# Patient Record
Sex: Female | Born: 1980 | Race: White | Hispanic: No | Marital: Married | Smoking: Never smoker
Health system: Southern US, Community
[De-identification: ages and names within clinical notes are randomized; demographics above are authoritative.]

## PROBLEM LIST (undated history)

## (undated) DIAGNOSIS — T7840XA Allergy, unspecified, initial encounter: Secondary | ICD-10-CM

## (undated) DIAGNOSIS — Z789 Other specified health status: Secondary | ICD-10-CM

## (undated) DIAGNOSIS — I10 Essential (primary) hypertension: Secondary | ICD-10-CM

## (undated) HISTORY — DX: Essential (primary) hypertension: I10

## (undated) HISTORY — DX: Allergy, unspecified, initial encounter: T78.40XA

## (undated) HISTORY — PX: NO PAST SURGERIES: SHX2092

---

## 2007-01-19 ENCOUNTER — Ambulatory Visit: Payer: Self-pay | Admitting: Family Medicine

## 2007-09-08 ENCOUNTER — Ambulatory Visit: Payer: Self-pay | Admitting: Family Medicine

## 2007-09-08 DIAGNOSIS — L03019 Cellulitis of unspecified finger: Secondary | ICD-10-CM | POA: Insufficient documentation

## 2007-11-06 ENCOUNTER — Encounter: Payer: Self-pay | Admitting: Family Medicine

## 2009-11-08 ENCOUNTER — Inpatient Hospital Stay (HOSPITAL_COMMUNITY): Admission: AD | Admit: 2009-11-08 | Discharge: 2009-11-08 | Payer: Self-pay | Admitting: Obstetrics and Gynecology

## 2009-11-08 ENCOUNTER — Inpatient Hospital Stay (HOSPITAL_COMMUNITY): Admission: AD | Admit: 2009-11-08 | Discharge: 2009-11-10 | Payer: Self-pay | Admitting: Obstetrics and Gynecology

## 2009-11-16 ENCOUNTER — Ambulatory Visit: Admission: RE | Admit: 2009-11-16 | Discharge: 2009-11-16 | Payer: Self-pay | Admitting: Obstetrics and Gynecology

## 2009-11-23 ENCOUNTER — Ambulatory Visit: Admission: RE | Admit: 2009-11-23 | Discharge: 2009-11-23 | Payer: Self-pay | Admitting: Obstetrics and Gynecology

## 2010-12-16 NOTE — L&D Delivery Note (Signed)
Delivery Note At 1:58 AM a viable female was delivered via  (Presentation: ;  ).  APGAR: , ; weight .   Placenta status: , .  Cord:  with the following complications: .  Cord pH: 7.10  Anesthesia:   Episiotomy:  Lacerations:  Suture Repair: vicryl rapide Est. Blood Loss (mL):   Mom to postpartum.  Baby to nursery-stable.  Rapid progress to SVD, straight OP w/ tight nuchal cord X 1, clamped + cut.  Infant required O2 with Ambu>>>NICU called, rapid recovery with APGAR 5/8, placenta spot intact, 2 deg perineal lac repaired.  Mother + baby doing well  Meriel Pica 09/06/2011, 2:19 AM

## 2011-03-20 LAB — CBC
HCT: 40.2 % (ref 36.0–46.0)
Hemoglobin: 12.3 g/dL (ref 12.0–15.0)
MCHC: 34.2 g/dL (ref 30.0–36.0)
Platelets: 182 10*3/uL (ref 150–400)
RBC: 3.74 MIL/uL — ABNORMAL LOW (ref 3.87–5.11)
RDW: 14.2 % (ref 11.5–15.5)
WBC: 16.4 10*3/uL — ABNORMAL HIGH (ref 4.0–10.5)

## 2011-03-20 LAB — RPR: RPR Ser Ql: NONREACTIVE

## 2011-05-03 NOTE — Assessment & Plan Note (Signed)
[facility]                           [facility] OFFICE NOTE   NAME:SLOANETashianna, Broome                         MRN:          [id number]  DATE:01/19/2007                            DOB:          1981/07/11    CHIEF COMPLAINT:  A 30 year old white female here to establish new  doctor.   HISTORY OF PRESENT ILLNESS:  Ms. Nienhuis moved from [place] in April  2007.  She has not seen a primary care doctor in many years.  She comes  to clinic today with the following concern:  Infected finger, chronic:  She states that she has been having problems with erythema, raised nail  bed, and intermittent drainage off and on since August of 2007 in her  right third digit at the nailbed.  She states that it drains thick white-  yellow fluid occasionally.  It is not usually very tender.  She has had  some effect on her nail where it is now flaking off and is rippled on  the lateral edge.  She works at [employer] and so her hands are wet very  frequently.  She does not bite her nails or suck on her fingers.   REVIEW OF SYSTEMS:  No headache, no dyspnea, wears contacts, no hearing  problems, no chest pain, palpitations, nausea, vomiting, diarrhea,  constipation, myalgias, arthralgias.   PAST MEDICAL HISTORY:  None.   PAST SURGERY OR PROCEDURES:  Pap smear January 2008 negative.   ALLERGIES:  NONE.   MEDICATIONS:  1. Aviane birth control pills.  2. Calcium 1 tablet daily.   SOCIAL HISTORY:  She is a former smoker with 1 pack/year history  approximately 10 years ago.  She rarely drinks alcohol, about 1-2  glasses of wine per month.  She has no history of drug use.  She works  at [employer] as well as [employer] part time.  She has been married  for 3 years, she has no children.  She exercises 3 times per week, 30  minutes at a time cardiovascular.  She eats 3 meals a day with  vegetables, occasional fruit, and avoid fast food.   FAMILY HISTORY:  Father alive at age  41 with testicular cancer.  Mother  alive at age 68 healthy.  No heart history before age 59.  One brother  healthy.  No diabetes.  Paternal grandmother with breast cancer  developed at age 106.  Paternal grandfather with renal cell cancer.  Maternal grandfather with lung cancer.  Maternal grandmother with  leukemia.   PHYSICAL EXAMINATION:  VITAL SIGNS:  Height 68 inches, weight 142, blood  pressure 96/76, pulse 68, temperature 98.1.  GENERAL  Healthy appearing female in no apparent distress.  HEENT:  PERRLA, extraocular muscles intact, oropharynx clear, tympanic  membranes clear, nares clear, no thyromegaly, no lymphadenopathy  supraclavicular or cervical.  EXTREMITIES:  Third digit medial nail bed slightly erythematous, raised,  slightly swollen, nail irregular and flaking along medial third.  No  fluctuance, nontender.  CARDIOVASCULAR:  Regular rate and rhythm, no murmurs, rubs, or gallops.  Normal PMI, 2+ peripheral pulses,  no peripheral edema.  LUNGS:  Clear to auscultation bilaterally.  No wheezes, rales, or  rhonchi.  ABDOMEN:  Soft, nontender, normoactive bowel sounds, no  hepatosplenomegaly.  MUSCULOSKELETAL:  Strength 5/5 in upper and lower extremities.  NEURO:  Cranial nerves II-XII grossly intact, alert and oriented x3.  REFLEXES:  Patellar 2+.   ASSESSMENT/PLAN:  1. Chronic paronychia:  The patient was given information on how to      prevent this happening in the future such as not sucking on      fingers, avoiding frequent water immersion by wearing gloves if      possible.  Given the recurrent low grade infection that has not      resolved, I do think there is likely a bacterial component at this      point and will treat her with Bactrim 2 tablets p.o. b.i.d. to      cover for broad skin bacteria as well methicillin-resistant      Staphylococcus aureus .  If her symptoms do not improve we can      always consider clipping the nail to consider fungal infection,       candidal infection, or can consider steroid cream at nail base for      possible dermatitis.   1. Prevention:  She was encouraged to continue with her healthy habits      of exercise and eating.  She was offered tetanus but refused.  She      was offered cholesterol screen and she will consider this.  She      will continue calcium daily.     Kerby Nora, MD  Electronically Signed    AB/MedQ  DD: 01/19/2007  DT: 01/19/2007  Job #: [id number]

## 2011-09-06 ENCOUNTER — Encounter (HOSPITAL_COMMUNITY): Payer: Self-pay | Admitting: *Deleted

## 2011-09-06 ENCOUNTER — Inpatient Hospital Stay (HOSPITAL_COMMUNITY)
Admission: AD | Admit: 2011-09-06 | Discharge: 2011-09-08 | DRG: 373 | Disposition: A | Discharge status: Home / Self Care | Payer: BC Managed Care – PPO | Source: Ambulatory Visit | Attending: Obstetrics and Gynecology | Admitting: Obstetrics and Gynecology

## 2011-09-06 LAB — COMPREHENSIVE METABOLIC PANEL
ALT: 12 U/L (ref 0–35)
AST: 21 U/L (ref 0–37)
Albumin: 2.7 g/dL — ABNORMAL LOW (ref 3.5–5.2)
Alkaline Phosphatase: 173 U/L — ABNORMAL HIGH (ref 39–117)
CO2: 20 mEq/L (ref 19–32)
Chloride: 105 mEq/L (ref 96–112)
GFR calc non Af Amer: >60 mL/min (ref >60)
Potassium: 3.9 mEq/L (ref 3.5–5.1)
Total Bilirubin: 0.3 mg/dL (ref 0.3–1.2)

## 2011-09-06 LAB — CBC
HCT: 36.5 % (ref 36.0–46.0)
Hemoglobin: 13.2 g/dL (ref 12.0–15.0)
MCH: 31.4 pg (ref 26.0–34.0)
MCHC: 34 g/dL (ref 30.0–36.0)
MCV: 92.4 fL (ref 78.0–100.0)
MCV: 92.4 fL (ref 78.0–100.0)
Platelets: 186 10*3/uL (ref 150–400)
RBC: 4.23 MIL/uL (ref 3.87–5.11)
RDW: 14.1 % (ref 11.5–15.5)
WBC: 17.4 10*3/uL — ABNORMAL HIGH (ref 4.0–10.5)
WBC: 20.7 10*3/uL — ABNORMAL HIGH (ref 4.0–10.5)

## 2011-09-06 LAB — RPR: RPR: NONREACTIVE

## 2011-09-06 LAB — URIC ACID: Uric Acid, Serum: 3.5 mg/dL (ref 2.4–7.0)

## 2011-09-06 LAB — HIV ANTIBODY (ROUTINE TESTING W REFLEX): HIV: NONREACTIVE

## 2011-09-06 LAB — ANTIBODY SCREEN: Antibody Screen: NEGATIVE

## 2011-09-06 MED ORDER — OXYTOCIN BOLUS FROM INFUSION
500.0000 mL | Freq: Once | INTRAVENOUS | Status: AC
  Administered 2011-09-06: 500 mL via INTRAVENOUS
  Filled 2011-09-06: qty 500

## 2011-09-06 MED ORDER — BISACODYL 10 MG RE SUPP
10.0000 mg | Freq: Every day | RECTAL | Status: DC | PRN
  Filled 2011-09-06: qty 1

## 2011-09-06 MED ORDER — PRENATAL PLUS 27-1 MG PO TABS
1.0000 | ORAL_TABLET | Freq: Every day | ORAL | Status: DC
  Administered 2011-09-07 – 2011-09-08 (×2): 1 via ORAL
  Filled 2011-09-06 (×2): qty 1

## 2011-09-06 MED ORDER — BENZOCAINE-MENTHOL 20-0.5 % EX AERO
1.0000 "application " | INHALATION_SPRAY | CUTANEOUS | Status: DC | PRN
  Administered 2011-09-06: 1 via TOPICAL
  Filled 2011-09-06: qty 56

## 2011-09-06 MED ORDER — ONDANSETRON HCL 4 MG/2ML IJ SOLN: 4.0000 mg | Freq: Four times a day (QID) | INTRAMUSCULAR | Status: DC | PRN

## 2011-09-06 MED ORDER — OXYTOCIN 20 UNITS IN LACTATED RINGERS INFUSION - SIMPLE
INTRAVENOUS | Status: AC
  Filled 2011-09-06: qty 1000

## 2011-09-06 MED ORDER — MEASLES, MUMPS & RUBELLA VAC ~~LOC~~ INJ
0.5000 mL | INJECTION | Freq: Once | SUBCUTANEOUS | Status: DC
  Filled 2011-09-06: qty 0.5

## 2011-09-06 MED ORDER — LIDOCAINE HCL (PF) 1 % IJ SOLN
30.0000 mL | INTRAMUSCULAR | Status: DC | PRN
  Administered 2011-09-06: 30 mL via SUBCUTANEOUS

## 2011-09-06 MED ORDER — ONDANSETRON HCL 4 MG PO TABS: 4.0000 mg | ORAL_TABLET | ORAL | Status: DC | PRN

## 2011-09-06 MED ORDER — CITRIC ACID-SODIUM CITRATE 334-500 MG/5ML PO SOLN: 30.0000 mL | ORAL | Status: DC | PRN

## 2011-09-06 MED ORDER — LACTATED RINGERS IV SOLN
INTRAVENOUS | Status: DC
  Administered 2011-09-06: 02:00:00 via INTRAVENOUS

## 2011-09-06 MED ORDER — IBUPROFEN 800 MG PO TABS
800.0000 mg | ORAL_TABLET | Freq: Three times a day (TID) | ORAL | Status: DC | PRN
  Administered 2011-09-07: 600 mg via ORAL
  Administered 2011-09-07 – 2011-09-08 (×2): 800 mg via ORAL
  Filled 2011-09-06 (×4): qty 1

## 2011-09-06 MED ORDER — SIMETHICONE 80 MG PO CHEW: 80.0000 mg | CHEWABLE_TABLET | ORAL | Status: DC | PRN

## 2011-09-06 MED ORDER — OXYTOCIN 20 UNITS IN LACTATED RINGERS INFUSION - SIMPLE: 125.0000 mL/h | Freq: Once | INTRAVENOUS | Status: DC

## 2011-09-06 MED ORDER — SENNOSIDES-DOCUSATE SODIUM 8.6-50 MG PO TABS
2.0000 | ORAL_TABLET | Freq: Every day | ORAL | Status: DC
  Administered 2011-09-07: 2 via ORAL

## 2011-09-06 MED ORDER — BENZOCAINE-MENTHOL 20-0.5 % EX AERO
INHALATION_SPRAY | CUTANEOUS | Status: AC
  Filled 2011-09-06: qty 56

## 2011-09-06 MED ORDER — LIDOCAINE HCL (PF) 1 % IJ SOLN
INTRAMUSCULAR | Status: AC
  Filled 2011-09-06: qty 30

## 2011-09-06 MED ORDER — FLEET ENEMA 7-19 GM/118ML RE ENEM: 1.0000 | ENEMA | RECTAL | Status: DC | PRN

## 2011-09-06 MED ORDER — DIBUCAINE 1 % RE OINT
1.0000 "application " | TOPICAL_OINTMENT | RECTAL | Status: DC | PRN
  Filled 2011-09-06 (×2): qty 28

## 2011-09-06 MED ORDER — ZOLPIDEM TARTRATE 5 MG PO TABS: 5.0000 mg | ORAL_TABLET | Freq: Every evening | ORAL | Status: DC | PRN

## 2011-09-06 MED ORDER — WITCH HAZEL-GLYCERIN EX PADS: 1.0000 "application " | MEDICATED_PAD | CUTANEOUS | Status: DC | PRN

## 2011-09-06 MED ORDER — DIPHENHYDRAMINE HCL 25 MG PO CAPS: 25.0000 mg | ORAL_CAPSULE | Freq: Four times a day (QID) | ORAL | Status: DC | PRN

## 2011-09-06 MED ORDER — OXYCODONE-ACETAMINOPHEN 5-325 MG PO TABS: 2.0000 | ORAL_TABLET | ORAL | Status: DC | PRN

## 2011-09-06 MED ORDER — TETANUS-DIPHTH-ACELL PERTUSSIS 5-2.5-18.5 LF-MCG/0.5 IM SUSP
0.5000 mL | Freq: Once | INTRAMUSCULAR | Status: DC
  Filled 2011-09-06: qty 0.5

## 2011-09-06 MED ORDER — LANOLIN HYDROUS EX OINT: TOPICAL_OINTMENT | CUTANEOUS | Status: DC | PRN

## 2011-09-06 MED ORDER — DEXTROSE IN LACTATED RINGERS 5 % IV SOLN: INTRAVENOUS | Status: DC

## 2011-09-06 MED ORDER — ONDANSETRON HCL 4 MG/2ML IJ SOLN: 4.0000 mg | INTRAMUSCULAR | Status: DC | PRN

## 2011-09-06 MED ORDER — OXYCODONE-ACETAMINOPHEN 5-325 MG PO TABS: 1.0000 | ORAL_TABLET | Freq: Four times a day (QID) | ORAL | Status: DC | PRN

## 2011-09-06 MED ORDER — ACETAMINOPHEN 325 MG PO TABS: 650.0000 mg | ORAL_TABLET | ORAL | Status: DC | PRN

## 2011-09-06 MED ORDER — LACTATED RINGERS IV SOLN
500.0000 mL | INTRAVENOUS | Status: DC | PRN
  Administered 2011-09-06: 300 mL via INTRAVENOUS

## 2011-09-06 MED ORDER — IBUPROFEN 600 MG PO TABS
600.0000 mg | ORAL_TABLET | Freq: Four times a day (QID) | ORAL | Status: DC | PRN
  Administered 2011-09-06: 600 mg via ORAL
  Filled 2011-09-06: qty 1

## 2011-09-06 NOTE — Progress Notes (Signed)
Admission screening reveals patient had Tdap in 01/2009 and is rubella immune. Orders removed from standing PP orders.

## 2011-09-06 NOTE — Progress Notes (Signed)
Pt taken to room 165 at this time.

## 2011-09-06 NOTE — Progress Notes (Signed)
Pt G2 P1 at 40wks, leaking clear fluid since 0015.  Denies any problems with pregnancy.

## 2011-09-06 NOTE — Progress Notes (Addendum)
Dr. Marcelle Overlie notified of increased blood pressures. MD states that he does not treat unless diastolic BP is 105. Orders received to draw PIH labs.

## 2011-09-06 NOTE — Progress Notes (Signed)
Notified of VE.  Pt may go to room 165. 

## 2011-09-06 NOTE — Progress Notes (Signed)
Dr Marcelle Overlie notified of pt presenting for labor check. Notified of ctx pattern and fhr pattern.  Notified of VE.

## 2011-09-06 NOTE — H&P (Signed)
Haydn Cush is a 30 y.o. female presenting for advanced labor. Maternal Medical History:  Reason for admission: Reason for admission: rupture of membranes and contractions.  Contractions: Onset was 3-5 hours ago.   Frequency: regular.   Perceived severity is moderate.    Fetal activity: Perceived fetal activity is normal.   Last perceived fetal movement was within the past hour.      OB History    Grav Para Term Preterm Abortions TAB SAB Ect Mult Living   2 1 1  0 0 0 0 0 0 1     No past medical history on file. No past surgical history on file. Family History: family history is not on file. Social History:  does not have a smoking history on file. She does not have any smokeless tobacco history on file. Her alcohol and drug histories not on file.  ROS  Dilation: 10 Effacement (%): 100 Station: -1 Exam by:: Daiva Nakayama, RN Blood pressure 135/80, pulse 103, temperature 98.3 F (36.8 C), temperature source Oral, resp. rate 20, height 5\' 7"  (1.702 m), weight 90.719 kg (200 lb), SpO2 100.00%. Exam Physical Exam  Prenatal labs: ABO, Rh: O/Positive/-- (09/21 0142) Antibody: Negative (09/21 0142) Rubella: Immune (09/21 0143) RPR: Nonreactive (09/21 0142)  HBsAg: Negative (09/21 0143)  HIV: Non-reactive (09/21 0143)  GBS: Negative (09/21 0143)   Assessment/Plan: Term preg., advanced labor>>>to L and D   Deysy Schabel M 09/06/2011, 2:18 AM

## 2011-09-07 NOTE — Progress Notes (Signed)
No C/O Lochia decreasing  VSS Afeb FFNT  Satisfactory

## 2011-09-08 MED ORDER — OXYCODONE-ACETAMINOPHEN 5-325 MG PO TABS: 1.0000 | ORAL_TABLET | Freq: Four times a day (QID) | ORAL | Status: AC | PRN

## 2011-09-08 NOTE — Discharge Summary (Signed)
Obstetric Discharge Summary Reason for Admission: onset of labor Prenatal Procedures: ultrasound Intrapartum Procedures: spontaneous vaginal delivery Postpartum Procedures: none Complications-Operative and Postpartum: none Hemoglobin  Date Value Range Status  09/06/2011 12.4  12.0-15.0 (g/dL) Final     HCT  Date Value Range Status  09/06/2011 36.5  36.0-46.0 (%) Final    Discharge Diagnoses: Term Pregnancy-delivered  Discharge Information: Date: 09/08/2011 Activity: pelvic rest Diet: routine Medications: PNV and Percocet Condition: stable Instructions: refer to practice specific booklet Discharge to: home Follow-up Information    Follow up with ADKINS,GRETCHEN. Call in 6 weeks.   Contact information:   [address], [address] [place] [address] [phone]          Newborn Data: Live born female  Birth Weight: 8 lb 7.1 oz (3830 g) APGAR: 5, 8  Home with mother.  Roxie Gueye II,Sherlyne Crownover E 09/08/2011, 6:28 AM

## 2011-09-08 NOTE — Progress Notes (Signed)
No C/O Decreasing lochia  VSS Afeb FFNT  D/C home Instructions given Percocet 5/325 #30 PNV 

## 2014-03-30 ENCOUNTER — Encounter: Payer: Self-pay | Admitting: Family Medicine

## 2014-03-30 ENCOUNTER — Ambulatory Visit (INDEPENDENT_AMBULATORY_CARE_PROVIDER_SITE_OTHER): Payer: 59 | Admitting: Family Medicine

## 2014-03-30 VITALS — BP 116/78 | HR 80 | Temp 98.2°F | Ht 66.75 in | Wt 156.5 lb

## 2014-03-30 DIAGNOSIS — J029 Acute pharyngitis, unspecified: Secondary | ICD-10-CM

## 2014-03-30 DIAGNOSIS — J069 Acute upper respiratory infection, unspecified: Secondary | ICD-10-CM

## 2014-03-30 LAB — POCT RAPID STREP A (OFFICE): RAPID STREP A SCREEN: NEGATIVE

## 2014-03-30 NOTE — Progress Notes (Signed)
Pre visit review using our clinic review tool, if applicable. No additional management support is needed unless otherwise documented below in the visit note. 

## 2014-03-30 NOTE — Progress Notes (Signed)
Patient Name: Christine Rivera Date of Birth: 1981/02/11 Medical Record Number: [id number]  History of Present Illness:  This 33 y.o. female patient presents with runny nose, sneezing, cough, sore throat, malaise and minimal / low-grade fever .  New OV, no enc since 2008.  Has been sick since last wed.  Started out with a sore throat and had some allergies.  Thursday and Friday and had some fever and felt tired with some faigue.   Only a little bit of a cough. Cough mostly gone.  Nausea yesterday.   Sick.  ? recent exposure to others with similar symptoms.   Denies sthortness of breath/wheezing, high fever, chest pain, rhinits for more than 14 days, significant myalgia, otalgia, facial pain, abdominal pain, changes in bowel or bladder.  PMH, PHS, Allergies, Problem List, Medications, Family History, and Social History have all been reviewed.  Patient Active Problem List   Diagnosis Date Noted  . PARONYCHIA, FINGER 09/08/2007    No past medical history on file.  No past surgical history on file.  History   Social History  . Marital Status: Married    Spouse Name: N/A    Number of Children: N/A  . Years of Education: N/A   Occupational History  . Not on file.   Social History Main Topics  . Smoking status: Never Smoker   . Smokeless tobacco: Not on file  . Alcohol Use: Not on file  . Drug Use: Not on file  . Sexual Activity: Not on file   Other Topics Concern  . Not on file   Social History Narrative  . No narrative on file    No family history on file.  No Known Allergies  Medication list reviewed and updated in full in [facility].  Review of Systems: as above, eating and drinking - tolerating PO. Urinating normally. No excessive vomitting or diarrhea. O/w as above.  Physical Exam:  Filed Vitals:   03/30/14 1516  BP: 116/78  Pulse: 80  Temp: 98.2 F (36.8 C)  TempSrc: Oral  Height: 5' 6.75" (1.695 m)  Weight: 156 lb 8 oz (70.988 kg)    SpO2: 97%    GEN: WDWN, Non-toxic, Atraumatic, normocephalic. A and O x 3. HEENT: Oropharynx clear without exudate, MMM, no significant LAD, mild rhinnorhea Ears: TM clear, COL visualized with good landmarks CV: RRR, no m/g/r. Pulm: CTA B, no wheezes, rhonchi, or crackles, normal respiratory effort. EXT: no c/c/e Psych: well oriented, neither depressed nor anxious in appearance  Objective Data: Results for orders placed in visit on 03/30/14  POCT RAPID STREP A (OFFICE)      Result Value Ref Range   Rapid Strep A Screen Negative  Negative    A/P: 1. URI. Supportive care reviewed with patient. See patient instruction section.  Patient Instructions: Upper Respiratory Infection and Viral Syndromes -Viral Infections  TREATMENT THAT HELPS WITH SYMPTOMS: 1. Drink plenty of fluids, but limit caffeine  2. Decongestant: for congested noses, sinuses, and ear tubes. Pressure release and help drainage: Sudafed or D component of combination medication. (pseudephedrine or Phenylephrine) (NOT IF YOU HAVE HIGH BLOOD PRESSURE) Avoid at night: sudafed makes it harder to sleep  3. Nasal Sprays: Relieve pressure, promote drainage, open nasal and ear passages. Afrin can be used for only 3-4 days in row.  4. Cough Suppressants: Delsym is an example of a cough suppressant. It lasts for 12 hours. Strongest over the counter.   6. Expectorants: Liquify secretions and improve drainage Mucinex  is a 12 hour expectorant. (Extended release guaifenesin)  For cheaper expectorant or Mucinex alternative, get generic guaifenesin (Not extended release) from any drug store. Take 1 1/2 tablets in the morning and 1 1/2 tablets at noon.   THESE WILL MAKE YOU FEEL BETTER FOR A WHILE, SO YOU CAN DEAL WITH YOUR SYMPTOMS. YOUR BODY HAS TO HEAL ITSELF.  ANTIBIOTICS DO NOT HELP IF YOU HAVE A VIRUS OR BAD COLD.  Antibiotics kill bacteria not viruses.  Bad viruses can make you feel just as bad or worse than bacterial  infections (Like the flu - it is a virus)  Ask a pharmacist for help if you cannot find these medications, and they will help you.   Signed,  Maud Deed. Emilyanne Mcgough, MD, [place] at [facility] [place] [address] Phone: [phone] Fax: [phone]   Patient's Medications   No medications on file

## 2014-04-08 ENCOUNTER — Ambulatory Visit (INDEPENDENT_AMBULATORY_CARE_PROVIDER_SITE_OTHER): Payer: 59 | Admitting: Family Medicine

## 2014-04-08 ENCOUNTER — Encounter: Payer: Self-pay | Admitting: Family Medicine

## 2014-04-08 VITALS — BP 110/80 | HR 86 | Temp 98.2°F | Ht 66.75 in | Wt 160.8 lb

## 2014-04-08 DIAGNOSIS — Z0000 Encounter for general adult medical examination without abnormal findings: Secondary | ICD-10-CM

## 2014-04-08 DIAGNOSIS — Z13220 Encounter for screening for lipoid disorders: Secondary | ICD-10-CM

## 2014-04-08 NOTE — Progress Notes (Signed)
   Subjective:    Patient ID: Christine Rivera, female    DOB: 1981-07-19, 33 y.o.   MRN: [id number]  HPI   33 year old female presents to re-establish. She has been followed by Dr.Adkins. GYN does yearly physical.  Viral URI symptoms are improved since last week.   No concerns.  Doing well overall.    Review of Systems  Constitutional: Negative for fever and fatigue.  HENT: Negative for ear pain.   Eyes: Negative for pain.  Respiratory: Negative for cough and wheezing.   Cardiovascular: Negative for chest pain, palpitations and leg swelling.  Gastrointestinal: Negative for abdominal pain.       Objective:   Physical Exam  Constitutional: Vital signs are normal. She appears well-developed and well-nourished. She is cooperative.  Non-toxic appearance. She does not appear ill. No distress.  HENT:  Head: Normocephalic.  Right Ear: Hearing, tympanic membrane, external ear and ear canal normal.  Left Ear: Hearing, tympanic membrane, external ear and ear canal normal.  Nose: Nose normal.  Eyes: Conjunctivae, EOM and lids are normal. Pupils are equal, round, and reactive to light. Lids are everted and swept, no foreign bodies found.  Neck: Trachea normal and normal range of motion. Neck supple. Carotid bruit is not present. No mass and no thyromegaly present.  Cardiovascular: Normal rate, regular rhythm, S1 normal, S2 normal, normal heart sounds and intact distal pulses.  Exam reveals no gallop.   No murmur heard. Pulmonary/Chest: Effort normal and breath sounds normal. No respiratory distress. She has no wheezes. She has no rhonchi. She has no rales.  Abdominal: Soft. Normal appearance and bowel sounds are normal. She exhibits no distension, no fluid wave, no abdominal bruit and no mass. There is no hepatosplenomegaly. There is no tenderness. There is no rebound, no guarding and no CVA tenderness. No hernia.  Lymphadenopathy:    She has no cervical adenopathy.    She has no axillary  adenopathy.  Neurological: She is alert. She has normal strength. No cranial nerve deficit or sensory deficit.  Skin: Skin is warm, dry and intact. No rash noted.  Psychiatric: Her speech is normal and behavior is normal. Judgment normal. Her mood appears not anxious. Cognition and memory are normal. She does not exhibit a depressed mood.          Assessment & Plan:  The patient's preventative maintenance and recommended screening tests for an annual wellness exam were reviewed in full today. Brought up to date unless services declined.  Counselled on the importance of diet, exercise, and its role in overall health and mortality. The patient's FH and SH was reviewed, including their home life, tobacco status, and drug and alcohol status.   Gyn does breast and pelvic exam  Up to date with TDap.

## 2014-04-08 NOTE — Patient Instructions (Addendum)
Schedule fasting lab  in next few months. Work on getting back to regular exercise 3-5 times a week, continue healthy eating.   Exercise to Stay Healthy Exercise helps you become and stay healthy. EXERCISE IDEAS AND TIPS Choose exercises that:  You enjoy.  Fit into your day. You do not need to exercise really hard to be healthy. You can do exercises at a slow or medium level and stay healthy. You can:  Stretch before and after working out.  Try yoga, Pilates, or tai chi.  Lift weights.  Walk fast, swim, jog, run, climb stairs, bicycle, dance, or rollerskate.  Take aerobic classes. Exercises that burn about 150 calories:  Running 1  miles in 15 minutes.  Playing volleyball for 45 to 60 minutes.  Washing and waxing a car for 45 to 60 minutes.  Playing touch football for 45 minutes.  Walking 1  miles in 35 minutes.  Pushing a stroller 1  miles in 30 minutes.  Playing basketball for 30 minutes.  Raking leaves for 30 minutes.  Bicycling 5 miles in 30 minutes.  Walking 2 miles in 30 minutes.  Dancing for 30 minutes.  Shoveling snow for 15 minutes.  Swimming laps for 20 minutes.  Walking up stairs for 15 minutes.  Bicycling 4 miles in 15 minutes.  Gardening for 30 to 45 minutes.  Jumping rope for 15 minutes.  Washing windows or floors for 45 to 60 minutes. Document Released: 01/04/2011 Document Revised: 02/24/2012 Document Reviewed: 01/04/2011 [facility] Patient Information 2014 [place], [place].

## 2014-04-08 NOTE — Progress Notes (Signed)
Pre visit review using our clinic review tool, if applicable. No additional management support is needed unless otherwise documented below in the visit note. 

## 2014-10-17 ENCOUNTER — Encounter: Payer: Self-pay | Admitting: Family Medicine

## 2015-03-03 LAB — OB RESULTS CONSOLE ABO/RH: RH TYPE: POSITIVE

## 2015-03-03 LAB — OB RESULTS CONSOLE HGB/HCT, BLOOD
HCT: 40 %
Hemoglobin: 13.3 g/dL

## 2015-03-03 LAB — OB RESULTS CONSOLE HIV ANTIBODY (ROUTINE TESTING): HIV: NONREACTIVE

## 2015-03-03 LAB — OB RESULTS CONSOLE RUBELLA ANTIBODY, IGM: Rubella: IMMUNE

## 2015-03-03 LAB — OB RESULTS CONSOLE ANTIBODY SCREEN: Antibody Screen: NEGATIVE

## 2015-03-03 LAB — OB RESULTS CONSOLE PLATELET COUNT: PLATELETS: 197 10*3/uL

## 2015-03-03 LAB — OB RESULTS CONSOLE RPR: RPR: NONREACTIVE

## 2015-03-03 LAB — OB RESULTS CONSOLE GC/CHLAMYDIA
Chlamydia: NEGATIVE
Gonorrhea: NEGATIVE

## 2015-03-03 LAB — OB RESULTS CONSOLE HEPATITIS B SURFACE ANTIGEN: HEP B S AG: NEGATIVE

## 2015-08-23 ENCOUNTER — Encounter (HOSPITAL_COMMUNITY): Payer: Self-pay | Admitting: *Deleted

## 2015-08-23 ENCOUNTER — Observation Stay (HOSPITAL_COMMUNITY): Payer: 59

## 2015-08-23 ENCOUNTER — Observation Stay (HOSPITAL_COMMUNITY)
Admission: AD | Admit: 2015-08-23 | Discharge: 2015-08-25 | Disposition: A | Discharge status: Home / Self Care | Payer: 59 | Source: Ambulatory Visit | Attending: Obstetrics & Gynecology | Admitting: Obstetrics & Gynecology

## 2015-08-23 DIAGNOSIS — O139 Gestational [pregnancy-induced] hypertension without significant proteinuria, unspecified trimester: Secondary | ICD-10-CM | POA: Diagnosis present

## 2015-08-23 DIAGNOSIS — Z3689 Encounter for other specified antenatal screening: Secondary | ICD-10-CM

## 2015-08-23 DIAGNOSIS — Z3A35 35 weeks gestation of pregnancy: Secondary | ICD-10-CM

## 2015-08-23 DIAGNOSIS — O133 Gestational [pregnancy-induced] hypertension without significant proteinuria, third trimester: Secondary | ICD-10-CM | POA: Diagnosis not present

## 2015-08-23 DIAGNOSIS — O09293 Supervision of pregnancy with other poor reproductive or obstetric history, third trimester: Secondary | ICD-10-CM

## 2015-08-23 DIAGNOSIS — O09523 Supervision of elderly multigravida, third trimester: Secondary | ICD-10-CM

## 2015-08-23 HISTORY — DX: Other specified health status: Z78.9

## 2015-08-23 LAB — CBC
HCT: 33.7 % — ABNORMAL LOW (ref 36.0–46.0)
Hemoglobin: 11 g/dL — ABNORMAL LOW (ref 12.0–15.0)
MCH: 28.6 pg (ref 26.0–34.0)
MCHC: 32.6 g/dL (ref 30.0–36.0)
MCV: 87.5 fL (ref 78.0–100.0)
Platelets: 165 10*3/uL (ref 150–400)
RBC: 3.85 MIL/uL — ABNORMAL LOW (ref 3.87–5.11)
RDW: 14.7 % (ref 11.5–15.5)
WBC: 7.8 10*3/uL (ref 4.0–10.5)

## 2015-08-23 LAB — PROTEIN / CREATININE RATIO, URINE
Creatinine, Urine: 134 mg/dL
PROTEIN CREATININE RATIO: 0.13 mg/mg{creat} (ref 0.00–0.15)
TOTAL PROTEIN, URINE: 18 mg/dL

## 2015-08-23 LAB — URIC ACID: Uric Acid, Serum: 4.3 mg/dL (ref 2.3–6.6)

## 2015-08-23 LAB — URINALYSIS, ROUTINE W REFLEX MICROSCOPIC
Bilirubin Urine: NEGATIVE
GLUCOSE, UA: NEGATIVE mg/dL
Hgb urine dipstick: NEGATIVE
Ketones, ur: NEGATIVE mg/dL
NITRITE: NEGATIVE
PH: 7 (ref 5.0–8.0)
Protein, ur: NEGATIVE mg/dL
SPECIFIC GRAVITY, URINE: 1.015 (ref 1.005–1.030)
Urobilinogen, UA: 0.2 mg/dL (ref 0.0–1.0)

## 2015-08-23 LAB — COMPREHENSIVE METABOLIC PANEL
ALT: 13 U/L — ABNORMAL LOW (ref 14–54)
AST: 20 U/L (ref 15–41)
Albumin: 2.9 g/dL — ABNORMAL LOW (ref 3.5–5.0)
Alkaline Phosphatase: 103 U/L (ref 38–126)
Anion gap: 8 (ref 5–15)
BUN: 9 mg/dL (ref 6–20)
CO2: 24 mmol/L (ref 22–32)
Calcium: 9.1 mg/dL (ref 8.9–10.3)
Chloride: 105 mmol/L (ref 101–111)
Creatinine, Ser: 0.64 mg/dL (ref 0.44–1.00)
GFR calc Af Amer: >60 mL/min (ref >60)
GFR calc non Af Amer: >60 mL/min (ref >60)
Glucose, Bld: 80 mg/dL (ref 65–99)
Potassium: 4.1 mmol/L (ref 3.5–5.1)
Sodium: 137 mmol/L (ref 135–145)
Total Bilirubin: 0.5 mg/dL (ref 0.3–1.2)
Total Protein: 6.3 g/dL — ABNORMAL LOW (ref 6.5–8.1)

## 2015-08-23 LAB — URINE MICROSCOPIC-ADD ON

## 2015-08-23 LAB — LACTATE DEHYDROGENASE: LDH: 138 U/L (ref 98–192)

## 2015-08-23 MED ORDER — PRENATAL MULTIVITAMIN CH
1.0000 | ORAL_TABLET | Freq: Every day | ORAL | Status: DC
  Administered 2015-08-24 – 2015-08-25 (×2): 1 via ORAL
  Filled 2015-08-23 (×4): qty 1

## 2015-08-23 MED ORDER — ZOLPIDEM TARTRATE 5 MG PO TABS: 5.0000 mg | ORAL_TABLET | Freq: Every evening | ORAL | Status: DC | PRN

## 2015-08-23 MED ORDER — CALCIUM CARBONATE ANTACID 500 MG PO CHEW
2.0000 | CHEWABLE_TABLET | ORAL | Status: DC | PRN
  Filled 2015-08-23: qty 2

## 2015-08-23 MED ORDER — DOCUSATE SODIUM 100 MG PO CAPS
100.0000 mg | ORAL_CAPSULE | Freq: Every day | ORAL | Status: DC
  Administered 2015-08-24 – 2015-08-25 (×2): 100 mg via ORAL
  Filled 2015-08-23 (×4): qty 1

## 2015-08-23 MED ORDER — ACETAMINOPHEN 325 MG PO TABS: 650.0000 mg | ORAL_TABLET | ORAL | Status: DC | PRN

## 2015-08-23 NOTE — Progress Notes (Signed)
Roland Earl, CNM discussed plan of care per Dr. Lynnette Caffey. Patient to U/S then to Room 155 for obs.

## 2015-08-23 NOTE — H&P (Signed)
Christine Rivera is a 34 y.o. female presenting for evaluation of elevated blood pressures in the office.  In the MAU, BPs were mostly mild range with a single severe.  Pre-eclampsia labs are wnl and urine negative for protein.  Patient has had no HA, CP/SOB, RUQ pain, or visual disturbance.  She reports active FM.  Antepartum course uncomplicated up to this point.   Maternal Medical History:  Reason for admission: Nausea.  Fetal activity: Perceived fetal activity is normal.   Last perceived fetal movement was within the past hour.    Prenatal complications: PIH.   Prenatal Complications - Diabetes: none.    OB History    Gravida Para Term Preterm AB TAB SAB Ectopic Multiple Living   3 2 2  0 0 0 0 0 0 2     Past Medical History  Diagnosis Date  . Medical history non-contributory    Past Surgical History  Procedure Laterality Date  . No past surgeries     Family History: family history includes Breast cancer in her paternal grandmother; Depression in her mother; Leukemia in her maternal grandmother; Lung cancer in her maternal grandfather; Renal cancer in her paternal grandfather; Stroke (age of onset: 17) in her mother; Testicular cancer (age of onset: 37) in her father. Social History:  reports that she has never smoked. She has never used smokeless tobacco. She reports that she drinks alcohol. She reports that she does not use illicit drugs.   Prenatal Transfer Tool  Maternal Diabetes: No Genetic Screening: Normal Maternal Ultrasounds/Referrals: Normal Fetal Ultrasounds or other Referrals:  None Maternal Substance Abuse:  No Significant Maternal Medications:  None Significant Maternal Lab Results:  None Other Comments:  None  Review of Systems  Eyes: Negative for blurred vision and double vision.  Respiratory: Negative for cough and shortness of breath.   Cardiovascular: Positive for leg swelling. Negative for chest pain.  Gastrointestinal: Negative for nausea, vomiting and  abdominal pain.  Neurological: Negative for headaches.      Blood pressure 147/95, pulse 94, temperature 98.2 F (36.8 C), temperature source Oral, resp. rate 20, height 5\' 7"  (1.702 m), weight 210 lb (95.255 kg). Maternal Exam:  Abdomen: Patient reports no abdominal tenderness. Fundal height is c/w dates.   Estimated fetal weight is 5#.       Physical Exam  Constitutional: She is oriented to person, place, and time. She appears well-developed and well-nourished.  GI: Soft. There is no tenderness. There is no rebound and no guarding.  Musculoskeletal: She exhibits edema.  Neurological: She is alert and oriented to person, place, and time. She displays abnormal reflex.  Reflex Scores:      Patellar reflexes are 3+ on the right side and 3+ on the left side. 1 beat of clonus  Skin: Skin is warm and dry.  Psychiatric: She has a normal mood and affect. Her behavior is normal.    Prenatal labs: ABO, Rh:   Antibody:   Rubella:   RPR: Nonreactive (03/18 0000)  HBsAg:    HIV: Non-reactive (03/18 0000)  GBS:     Assessment/Plan: 55HR [id number] at [redacted]w[redacted]d admitted for pre-eclampsia evaluation -Serial BPs -MFM u/s (done but report pending) -24 hour urine collection -AM labs pending -Plan to deliver for severe features.  These were discussed with the patient.  Custer Pimenta, [place] 08/23/2015, 9:33 PM

## 2015-08-23 NOTE — MAU Provider Note (Signed)
History     CSN: [id number]  Arrival date and time: 08/23/15 1227   First Provider Initiated Contact with Patient 08/23/15 1254      Chief Complaint  Patient presents with  . Hypertension   HPI  Christine Rivera 34 y.o. [id number] @ [redacted]w[redacted]d presents to MAU from the office for preeclampsia workup.  Past Medical History  Diagnosis Date  . Medical history non-contributory     Past Surgical History  Procedure Laterality Date  . No past surgeries      Family History  Problem Relation Age of Onset  . Depression Mother   . Stroke Mother 39    ruptured brain aneyrsm  . Testicular cancer Father 20  . Leukemia Maternal Grandmother   . Lung cancer Maternal Grandfather   . Breast cancer Paternal Grandmother   . Renal cancer Paternal Grandfather     Social History  Substance Use Topics  . Smoking status: Never Smoker   . Smokeless tobacco: Never Used  . Alcohol Use: 0.0 oz/week    0 Standard drinks or equivalent per week     Comment: on occasion    Allergies: No Known Allergies  No prescriptions prior to admission    Review of Systems  Constitutional: Negative for fever.  Eyes: Negative for blurred vision and double vision.  Gastrointestinal: Negative for abdominal pain.  Neurological: Negative for headaches.  All other systems reviewed and are negative.  Physical Exam   Blood pressure 152/103, pulse 101, temperature 98.1 F (36.7 C), temperature source Oral, resp. rate 18, height 5\' 7"  (1.702 m), weight 95.255 kg (210 lb).  Physical Exam  Nursing note and vitals reviewed. Constitutional: She is oriented to person, place, and time. She appears well-developed and well-nourished. No distress.  HENT:  Head: Normocephalic.  Cardiovascular: Normal rate.   Respiratory: Effort normal. No respiratory distress.  GI: Soft. There is no tenderness.  Musculoskeletal: Normal range of motion. She exhibits edema.  Bilateral lower extremeties  Neurological: She is alert and  oriented to person, place, and time.  Reflex Scores:      Patellar reflexes are 3+ on the right side and 3+ on the left side. 1 beat clonus  Skin: Skin is warm and dry.  Psychiatric: She has a normal mood and affect. Her behavior is normal. Judgment and thought content normal.   Results for orders placed or performed during the hospital encounter of 08/23/15 (from the past 24 hour(s))  Urinalysis, Routine w reflex microscopic (not at [facility])     Status: Abnormal   Collection Time: 08/23/15 12:40 PM  Result Value Ref Range   Color, Urine YELLOW YELLOW   APPearance HAZY (A) CLEAR   Specific Gravity, Urine 1.015 1.005 - 1.030   pH 7.0 5.0 - 8.0   Glucose, UA NEGATIVE NEGATIVE mg/dL   Hgb urine dipstick NEGATIVE NEGATIVE   Bilirubin Urine NEGATIVE NEGATIVE   Ketones, ur NEGATIVE NEGATIVE mg/dL   Protein, ur NEGATIVE NEGATIVE mg/dL   Urobilinogen, UA 0.2 0.0 - 1.0 mg/dL   Nitrite NEGATIVE NEGATIVE   Leukocytes, UA SMALL (A) NEGATIVE  Protein / creatinine ratio, urine     Status: None   Collection Time: 08/23/15 12:40 PM  Result Value Ref Range   Creatinine, Urine 134.00 mg/dL   Total Protein, Urine 18 mg/dL   Protein Creatinine Ratio 0.13 0.00 - 0.15 mg/mg[Cre]  Urine microscopic-add on     Status: Abnormal   Collection Time: 08/23/15 12:40 PM  Result Value Ref Range  Squamous Epithelial / LPF FEW (A) RARE   WBC, UA 3-6 <3 WBC/hpf   Bacteria, UA MANY (A) RARE  CBC     Status: Abnormal   Collection Time: 08/23/15  1:12 PM  Result Value Ref Range   WBC 7.8 4.0 - 10.5 K/uL   RBC 3.85 (L) 3.87 - 5.11 MIL/uL   Hemoglobin 11.0 (L) 12.0 - 15.0 g/dL   HCT 33.7 (L) 36.0 - 46.0 %   MCV 87.5 78.0 - 100.0 fL   MCH 28.6 26.0 - 34.0 pg   MCHC 32.6 30.0 - 36.0 g/dL   RDW 14.7 11.5 - 15.5 %   Platelets 165 150 - 400 K/uL  Comprehensive metabolic panel     Status: Abnormal   Collection Time: 08/23/15  1:12 PM  Result Value Ref Range   Sodium 137 135 - 145 mmol/L   Potassium 4.1 3.5 - 5.1  mmol/L   Chloride 105 101 - 111 mmol/L   CO2 24 22 - 32 mmol/L   Glucose, Bld 80 65 - 99 mg/dL   BUN 9 6 - 20 mg/dL   Creatinine, Ser 0.64 0.44 - 1.00 mg/dL   Calcium 9.1 8.9 - 10.3 mg/dL   Total Protein 6.3 (L) 6.5 - 8.1 g/dL   Albumin 2.9 (L) 3.5 - 5.0 g/dL   AST 20 15 - 41 U/L   ALT 13 (L) 14 - 54 U/L   Alkaline Phosphatase 103 38 - 126 U/L   Total Bilirubin 0.5 0.3 - 1.2 mg/dL   GFR calc non Af Amer >60 >60 mL/min   GFR calc Af Amer >60 >60 mL/min   Anion gap 8 5 - 15  Lactate dehydrogenase     Status: None   Collection Time: 08/23/15  1:12 PM  Result Value Ref Range   LDH 138 98 - 192 U/L  Uric acid     Status: None   Collection Time: 08/23/15  1:12 PM  Result Value Ref Range   Uric Acid, Serum 4.3 2.3 - 6.6 mg/dL   08/23/15 1341  --  89  --  --  141/95 mmHg  --  --  --  -- [place]     08/23/15 1331  --  93  --  --  150/96 mmHg  --  --  --  -- [place]    08/23/15 1322  --  89  --  --  153/96 mmHg  --  --  --  -- [place]    08/23/15 1311  --  96  --  --  162/95 mmHg  --  --  --  -- [place]    08/23/15 1301  --  93  --  --   145/104 mmHg  --  --  --  -- [place]    08/23/15 1251  --  101  --  --   152/103 mmHg  --  --  --  -- [place]    08/23/15 1242  98.1 F (36.7 C)  100  --  18  154/96 mmHg  --  --  --  95.255 kg (210 lb) [place]          MAU Course  Procedures  MDM Serial B/p's; Pending Pre- E labs; Results given to Dr. Lynnette Caffey. Pt will be admitted for Observation  Assessment and Plan  Preeclampsia Admit - Antepartum Unit  Christine Rivera 08/23/2015, 1:02 PM

## 2015-08-24 LAB — COMPREHENSIVE METABOLIC PANEL
ALBUMIN: 2.4 g/dL — AB (ref 3.5–5.0)
ALT: 11 U/L — AB (ref 14–54)
ANION GAP: 6 (ref 5–15)
AST: 18 U/L (ref 15–41)
Alkaline Phosphatase: 90 U/L (ref 38–126)
BUN: 9 mg/dL (ref 6–20)
CHLORIDE: 108 mmol/L (ref 101–111)
CO2: 24 mmol/L (ref 22–32)
Calcium: 8.4 mg/dL — ABNORMAL LOW (ref 8.9–10.3)
Creatinine, Ser: 0.67 mg/dL (ref 0.44–1.00)
GFR calc non Af Amer: >60 mL/min (ref >60)
GLUCOSE: 85 mg/dL (ref 65–99)
Potassium: 4.1 mmol/L (ref 3.5–5.1)
Sodium: 138 mmol/L (ref 135–145)
Total Bilirubin: 0.3 mg/dL (ref 0.3–1.2)
Total Protein: 5.5 g/dL — ABNORMAL LOW (ref 6.5–8.1)

## 2015-08-24 LAB — CBC
HCT: 32.7 % — ABNORMAL LOW (ref 36.0–46.0)
HEMOGLOBIN: 10.7 g/dL — AB (ref 12.0–15.0)
MCH: 28.6 pg (ref 26.0–34.0)
MCHC: 32.7 g/dL (ref 30.0–36.0)
MCV: 87.4 fL (ref 78.0–100.0)
Platelets: 145 10*3/uL — ABNORMAL LOW (ref 150–400)
RBC: 3.74 MIL/uL — AB (ref 3.87–5.11)
RDW: 14.6 % (ref 11.5–15.5)
WBC: 8.4 10*3/uL (ref 4.0–10.5)

## 2015-08-24 LAB — PROTEIN, URINE, 24 HOUR
COLLECTION INTERVAL-UPROT: 24 h
PROTEIN, 24H URINE: 123 mg/d — AB (ref 50–100)
Protein, Urine: 7 mg/dL
URINE TOTAL VOLUME-UPROT: 1750 mL

## 2015-08-24 NOTE — Progress Notes (Signed)
Pt denies complaints.  Reports good FM.  No HA, n/v, or visual changes.  AF, VSS BP 125-154/80s-90s Gen - NAD Abd - gravid, NT Ext - trace edema, 3+ DTR  Labs reviewed - wnl 24 hr urine in progress [place] report pending  A/P:  Mild pre-eclampsia Continue inpt bedrest and 24 hr urine If remains mild range - consider d/c home tomorrow and plan for IOL @ 37wks

## 2015-08-25 NOTE — Discharge Instructions (Signed)
Hypertension During Pregnancy Hypertension is also called high blood pressure. Blood pressure moves blood in your body. Sometimes, the force that moves the blood becomes too strong. When you are pregnant, this condition should be watched carefully. It can cause problems for you and your baby. HOME CARE   Make and keep all of your doctor visits.  Take medicine as told by your doctor. Tell your doctor about all medicines you take.  Eat very little salt.  Exercise regularly.  Do not drink alcohol.  Do not smoke.  Do not have drinks with caffeine.  Lie on your left side when resting.  Your health care provider may ask you to take one low-dose aspirin (81mg ) each day. GET HELP RIGHT AWAY IF:  You have bad belly (abdominal) pain.  You have sudden puffiness (swelling) in the hands, ankles, or face.  You gain 4 pounds (1.8 kilograms) or more in 1 week.  You throw up (vomit) repeatedly.  You have bleeding from the vagina.  You do not feel the baby moving as much.  You have a headache.  You have blurred or double vision.  You have muscle twitching or spasms.  You have shortness of breath.  You have blue fingernails and lips.  You have blood in your pee (urine). MAKE SURE YOU:  Understand these instructions.  Will watch your condition.  Will get help right away if you are not doing well or get worse. Document Released: 01/04/2011 Document Revised: 04/18/2014 Document Reviewed: 07/01/2013 [facility] Patient Information 2015 [place], [place]. This information is not intended to replace advice given to you by your health care provider. Make sure you discuss any questions you have with your health care provider. Fetal Movement Counts Patient Name: __________________________________________________ Patient Due Date: ____________________ Performing a fetal movement count is highly recommended in high-risk pregnancies, but it is good for every pregnant woman to do. Your health  care provider may ask you to start counting fetal movements at 28 weeks of the pregnancy. Fetal movements often increase:  After eating a full meal.  After physical activity.  After eating or drinking something sweet or cold.  At rest. Pay attention to when you feel the baby is most active. This will help you notice a pattern of your baby's sleep and wake cycles and what factors contribute to an increase in fetal movement. It is important to perform a fetal movement count at the same time each day when your baby is normally most active.  HOW TO COUNT FETAL MOVEMENTS  Find a quiet and comfortable area to sit or lie down on your left side. Lying on your left side provides the best blood and oxygen circulation to your baby.  Write down the day and time on a sheet of paper or in a journal.  Start counting kicks, flutters, swishes, rolls, or jabs in a 2-hour period. You should feel at least 10 movements within 2 hours.  If you do not feel 10 movements in 2 hours, wait 2-3 hours and count again. Look for a change in the pattern or not enough counts in 2 hours. SEEK MEDICAL CARE IF:  You feel less than 10 counts in 2 hours, tried twice.  There is no movement in over an hour.  The pattern is changing or taking longer each day to reach 10 counts in 2 hours.  You feel the baby is not moving as he or she usually does. Date: ____________ Movements: ____________ Start time: ____________ Elizebeth Koller time: ____________  Date:  ____________ Movements: ____________ Start time: ____________ Elizebeth Koller time: ____________ Date: ____________ Movements: ____________ Start time: ____________ Elizebeth Koller time: ____________ Date: ____________ Movements: ____________ Start time: ____________ Elizebeth Koller time: ____________ Date: ____________ Movements: ____________ Start time: ____________ Elizebeth Koller time: ____________ Date: ____________ Movements: ____________ Start time: ____________ Elizebeth Koller time: ____________ Date: ____________  Movements: ____________ Start time: ____________ Elizebeth Koller time: ____________ Date: ____________ Movements: ____________ Start time: ____________ Elizebeth Koller time: ____________  Date: ____________ Movements: ____________ Start time: ____________ Elizebeth Koller time: ____________ Date: ____________ Movements: ____________ Start time: ____________ Elizebeth Koller time: ____________ Date: ____________ Movements: ____________ Start time: ____________ Elizebeth Koller time: ____________ Date: ____________ Movements: ____________ Start time: ____________ Elizebeth Koller time: ____________ Date: ____________ Movements: ____________ Start time: ____________ Elizebeth Koller time: ____________ Date: ____________ Movements: ____________ Start time: ____________ Elizebeth Koller time: ____________ Date: ____________ Movements: ____________ Start time: ____________ Elizebeth Koller time: ____________  Date: ____________ Movements: ____________ Start time: ____________ Elizebeth Koller time: ____________ Date: ____________ Movements: ____________ Start time: ____________ Elizebeth Koller time: ____________ Date: ____________ Movements: ____________ Start time: ____________ Elizebeth Koller time: ____________ Date: ____________ Movements: ____________ Start time: ____________ Elizebeth Koller time: ____________ Date: ____________ Movements: ____________ Start time: ____________ Elizebeth Koller time: ____________ Date: ____________ Movements: ____________ Start time: ____________ Elizebeth Koller time: ____________ Date: ____________ Movements: ____________ Start time: ____________ Elizebeth Koller time: ____________  Date: ____________ Movements: ____________ Start time: ____________ Elizebeth Koller time: ____________ Date: ____________ Movements: ____________ Start time: ____________ Elizebeth Koller time: ____________ Date: ____________ Movements: ____________ Start time: ____________ Elizebeth Koller time: ____________ Date: ____________ Movements: ____________ Start time: ____________ Elizebeth Koller time: ____________ Date: ____________ Movements: ____________ Start time:  ____________ Elizebeth Koller time: ____________ Date: ____________ Movements: ____________ Start time: ____________ Elizebeth Koller time: ____________ Date: ____________ Movements: ____________ Start time: ____________ Elizebeth Koller time: ____________  Date: ____________ Movements: ____________ Start time: ____________ Elizebeth Koller time: ____________ Date: ____________ Movements: ____________ Start time: ____________ Elizebeth Koller time: ____________ Date: ____________ Movements: ____________ Start time: ____________ Elizebeth Koller time: ____________ Date: ____________ Movements: ____________ Start time: ____________ Elizebeth Koller time: ____________ Date: ____________ Movements: ____________ Start time: ____________ Elizebeth Koller time: ____________ Date: ____________ Movements: ____________ Start time: ____________ Elizebeth Koller time: ____________ Date: ____________ Movements: ____________ Start time: ____________ Elizebeth Koller time: ____________  Date: ____________ Movements: ____________ Start time: ____________ Elizebeth Koller time: ____________ Date: ____________ Movements: ____________ Start time: ____________ Elizebeth Koller time: ____________ Date: ____________ Movements: ____________ Start time: ____________ Elizebeth Koller time: ____________ Date: ____________ Movements: ____________ Start time: ____________ Elizebeth Koller time: ____________ Date: ____________ Movements: ____________ Start time: ____________ Elizebeth Koller time: ____________ Date: ____________ Movements: ____________ Start time: ____________ Elizebeth Koller time: ____________ Date: ____________ Movements: ____________ Start time: ____________ Elizebeth Koller time: ____________  Date: ____________ Movements: ____________ Start time: ____________ Elizebeth Koller time: ____________ Date: ____________ Movements: ____________ Start time: ____________ Elizebeth Koller time: ____________ Date: ____________ Movements: ____________ Start time: ____________ Elizebeth Koller time: ____________ Date: ____________ Movements: ____________ Start time: ____________ Elizebeth Koller time: ____________ Date:  ____________ Movements: ____________ Start time: ____________ Elizebeth Koller time: ____________ Date: ____________ Movements: ____________ Start time: ____________ Elizebeth Koller time: ____________ Date: ____________ Movements: ____________ Start time: ____________ Elizebeth Koller time: ____________  Date: ____________ Movements: ____________ Start time: ____________ Elizebeth Koller time: ____________ Date: ____________ Movements: ____________ Start time: ____________ Elizebeth Koller time: ____________ Date: ____________ Movements: ____________ Start time: ____________ Elizebeth Koller time: ____________ Date: ____________ Movements: ____________ Start time: ____________ Elizebeth Koller time: ____________ Date: ____________ Movements: ____________ Start time: ____________ Elizebeth Koller time: ____________ Date: ____________ Movements: ____________ Start time: ____________ Elizebeth Koller time: ____________ Document Released: 01/01/2007 Document Revised: 04/18/2014 Document Reviewed: 09/28/2012 [employer] Patient Information 2015 [employer]. This information is not intended to replace advice given to you by your health care provider. Make sure you discuss any questions you have with your  health care provider. ° °

## 2015-08-25 NOTE — Discharge Summary (Signed)
Physician Discharge Summary  Patient ID: Christine Rivera MRN: [id number] DOB/AGE: Apr 30, 1981 34 y.o.  Admit date: 08/23/2015 Discharge date: 08/25/2015  Admission Diagnoses:gestational HTN  Discharge Diagnoses: same Active Problems:   Gestational hypertension   Discharged Condition: good  Hospital Course: admitted for BP monitoringm serial labs.  BPs labile from 120-150s/70-100.  Most recent 121/73.  Labs stable and normal with 24h protien 123mg  (slightly elevated).  Normal [place] Pt deisres d/c home  Consults: None  Significant Diagnostic Studies: labs: normal  Treatments: BP monitoring, Fetal monitoring, serial labs  Discharge Exam: Blood pressure 121/73, pulse 88, temperature 98.8 F (37.1 C), temperature source Oral, resp. rate 18, height 5\' 7"  (1.702 m), weight 210 lb (95.255 kg), SpO2 100 %. General appearance: alert, cooperative, appears stated age and no distress Extremities: extremities normal, atraumatic, no cyanosis or edema  DTRs 1/4 Abd Gravid nt  Disposition: 01-Home or Self Care  Discharge Instructions    Call MD for:  difficulty breathing, headache or visual disturbances    Complete by:  As directed      Diet general    Complete by:  As directed      Discharge instructions    Complete by:  As directed   Increased rest Pre eclampsia precautions Follow up in office in 3 days for Bp check and labs            Medication List    TAKE these medications        acetaminophen 325 MG tablet  Commonly known as:  TYLENOL  Take 650 mg by mouth every 6 (six) hours as needed for mild pain.     calcium carbonate 500 MG chewable tablet  Commonly known as:  TUMS - dosed in mg elemental calcium  Chew 1 tablet by mouth as needed for indigestion.     prenatal multivitamin Tabs tablet  Take 1 tablet by mouth daily at 12 noon.         Signed: Johntavius Shepard C 08/25/2015, 8:27 AM

## 2015-08-28 ENCOUNTER — Inpatient Hospital Stay (HOSPITAL_COMMUNITY)
Admission: AD | Admit: 2015-08-28 | Discharge: 2015-08-31 | DRG: 774 | Disposition: A | Discharge status: Home / Self Care | Payer: 59 | Source: Ambulatory Visit | Attending: Obstetrics and Gynecology | Admitting: Obstetrics and Gynecology

## 2015-08-28 ENCOUNTER — Encounter (HOSPITAL_COMMUNITY): Payer: Self-pay | Admitting: *Deleted

## 2015-08-28 DIAGNOSIS — O1403 Mild to moderate pre-eclampsia, third trimester: Secondary | ICD-10-CM | POA: Diagnosis present

## 2015-08-28 DIAGNOSIS — Z818 Family history of other mental and behavioral disorders: Secondary | ICD-10-CM

## 2015-08-28 DIAGNOSIS — O133 Gestational [pregnancy-induced] hypertension without significant proteinuria, third trimester: Principal | ICD-10-CM | POA: Diagnosis present

## 2015-08-28 DIAGNOSIS — Z841 Family history of disorders of kidney and ureter: Secondary | ICD-10-CM

## 2015-08-28 DIAGNOSIS — Z8043 Family history of malignant neoplasm of testis: Secondary | ICD-10-CM

## 2015-08-28 DIAGNOSIS — Z803 Family history of malignant neoplasm of breast: Secondary | ICD-10-CM | POA: Diagnosis not present

## 2015-08-28 DIAGNOSIS — Z806 Family history of leukemia: Secondary | ICD-10-CM

## 2015-08-28 DIAGNOSIS — Z809 Family history of malignant neoplasm, unspecified: Secondary | ICD-10-CM | POA: Diagnosis not present

## 2015-08-28 DIAGNOSIS — Z3A35 35 weeks gestation of pregnancy: Secondary | ICD-10-CM | POA: Diagnosis present

## 2015-08-28 DIAGNOSIS — O1410 Severe pre-eclampsia, unspecified trimester: Secondary | ICD-10-CM | POA: Diagnosis present

## 2015-08-28 DIAGNOSIS — Z801 Family history of malignant neoplasm of trachea, bronchus and lung: Secondary | ICD-10-CM | POA: Diagnosis not present

## 2015-08-28 DIAGNOSIS — Z808 Family history of malignant neoplasm of other organs or systems: Secondary | ICD-10-CM | POA: Diagnosis not present

## 2015-08-28 LAB — URINALYSIS, ROUTINE W REFLEX MICROSCOPIC
Bilirubin Urine: NEGATIVE
Glucose, UA: NEGATIVE mg/dL
Hgb urine dipstick: NEGATIVE
KETONES UR: 15 mg/dL — AB
NITRITE: NEGATIVE
PROTEIN: NEGATIVE mg/dL
Specific Gravity, Urine: 1.02 (ref 1.005–1.030)
UROBILINOGEN UA: 0.2 mg/dL (ref 0.0–1.0)
pH: 5.5 (ref 5.0–8.0)

## 2015-08-28 LAB — PROTEIN / CREATININE RATIO, URINE
CREATININE, URINE: 141 mg/dL
PROTEIN CREATININE RATIO: 0.13 mg/mg{creat} (ref 0.00–0.15)
TOTAL PROTEIN, URINE: 19 mg/dL

## 2015-08-28 LAB — COMPREHENSIVE METABOLIC PANEL
ALBUMIN: 3.2 g/dL — AB (ref 3.5–5.0)
ALT: 13 U/L — ABNORMAL LOW (ref 14–54)
ANION GAP: 9 (ref 5–15)
AST: 26 U/L (ref 15–41)
Alkaline Phosphatase: 118 U/L (ref 38–126)
BILIRUBIN TOTAL: 0.6 mg/dL (ref 0.3–1.2)
BUN: 8 mg/dL (ref 6–20)
CHLORIDE: 107 mmol/L (ref 101–111)
CO2: 20 mmol/L — ABNORMAL LOW (ref 22–32)
Calcium: 9.1 mg/dL (ref 8.9–10.3)
Creatinine, Ser: 0.64 mg/dL (ref 0.44–1.00)
GFR calc Af Amer: >60 mL/min (ref >60)
GFR calc non Af Amer: >60 mL/min (ref >60)
GLUCOSE: 83 mg/dL (ref 65–99)
POTASSIUM: 3.9 mmol/L (ref 3.5–5.1)
SODIUM: 136 mmol/L (ref 135–145)
TOTAL PROTEIN: 7.1 g/dL (ref 6.5–8.1)

## 2015-08-28 LAB — CBC
HEMATOCRIT: 36.2 % (ref 36.0–46.0)
Hemoglobin: 11.9 g/dL — ABNORMAL LOW (ref 12.0–15.0)
MCH: 28.7 pg (ref 26.0–34.0)
MCHC: 32.9 g/dL (ref 30.0–36.0)
MCV: 87.2 fL (ref 78.0–100.0)
PLATELETS: 170 10*3/uL (ref 150–400)
RBC: 4.15 MIL/uL (ref 3.87–5.11)
RDW: 15 % (ref 11.5–15.5)
WBC: 11.6 10*3/uL — AB (ref 4.0–10.5)

## 2015-08-28 LAB — TYPE AND SCREEN
ABO/RH(D): O POS
ANTIBODY SCREEN: NEGATIVE

## 2015-08-28 LAB — LACTATE DEHYDROGENASE: LDH: 182 U/L (ref 98–192)

## 2015-08-28 LAB — URIC ACID: Uric Acid, Serum: 4 mg/dL (ref 2.3–6.6)

## 2015-08-28 LAB — URINE MICROSCOPIC-ADD ON

## 2015-08-28 MED ORDER — LIDOCAINE HCL (PF) 1 % IJ SOLN
30.0000 mL | INTRAMUSCULAR | Status: DC | PRN
  Administered 2015-08-29: 30 mL via SUBCUTANEOUS
  Filled 2015-08-28: qty 30

## 2015-08-28 MED ORDER — OXYCODONE-ACETAMINOPHEN 5-325 MG PO TABS: 1.0000 | ORAL_TABLET | ORAL | Status: DC | PRN

## 2015-08-28 MED ORDER — PENICILLIN G POTASSIUM 5000000 UNITS IJ SOLR
2.5000 10*6.[IU] | INTRAVENOUS | Status: DC
  Administered 2015-08-28: 2.5 10*6.[IU] via INTRAVENOUS
  Filled 2015-08-28 (×5): qty 2.5

## 2015-08-28 MED ORDER — LACTATED RINGERS IV SOLN
INTRAVENOUS | Status: DC
  Administered 2015-08-28 – 2015-08-29 (×2): via INTRAVENOUS

## 2015-08-28 MED ORDER — FLEET ENEMA 7-19 GM/118ML RE ENEM: 1.0000 | ENEMA | RECTAL | Status: DC | PRN

## 2015-08-28 MED ORDER — TERBUTALINE SULFATE 1 MG/ML IJ SOLN
0.2500 mg | Freq: Once | INTRAMUSCULAR | Status: DC | PRN
Start: 2015-08-28 — End: 2015-08-29

## 2015-08-28 MED ORDER — BUTORPHANOL TARTRATE 1 MG/ML IJ SOLN: 1.0000 mg | INTRAMUSCULAR | Status: DC | PRN

## 2015-08-28 MED ORDER — ACETAMINOPHEN 325 MG PO TABS: 650.0000 mg | ORAL_TABLET | ORAL | Status: DC | PRN

## 2015-08-28 MED ORDER — ONDANSETRON HCL 4 MG/2ML IJ SOLN: 4.0000 mg | Freq: Four times a day (QID) | INTRAMUSCULAR | Status: DC | PRN

## 2015-08-28 MED ORDER — OXYTOCIN 40 UNITS IN LACTATED RINGERS INFUSION - SIMPLE MED: 62.5000 mL/h | INTRAVENOUS | Status: DC

## 2015-08-28 MED ORDER — LACTATED RINGERS IV SOLN
500.0000 mL | INTRAVENOUS | Status: DC | PRN
  Administered 2015-08-29: 500 mL via INTRAVENOUS

## 2015-08-28 MED ORDER — MAGNESIUM SULFATE 50 % IJ SOLN
2.0000 g/h | INTRAVENOUS | Status: DC
  Filled 2015-08-28: qty 80

## 2015-08-28 MED ORDER — OXYTOCIN BOLUS FROM INFUSION
500.0000 mL | INTRAVENOUS | Status: DC
  Administered 2015-08-29: 500 mL via INTRAVENOUS

## 2015-08-28 MED ORDER — CITRIC ACID-SODIUM CITRATE 334-500 MG/5ML PO SOLN: 30.0000 mL | ORAL | Status: DC | PRN

## 2015-08-28 MED ORDER — MAGNESIUM SULFATE BOLUS VIA INFUSION
4.0000 g | Freq: Once | INTRAVENOUS | Status: AC
  Administered 2015-08-28: 4 g via INTRAVENOUS
  Filled 2015-08-28: qty 500

## 2015-08-28 MED ORDER — PENICILLIN G POTASSIUM 5000000 UNITS IJ SOLR
5.0000 10*6.[IU] | Freq: Once | INTRAVENOUS | Status: AC
  Administered 2015-08-28: 5 10*6.[IU] via INTRAVENOUS
  Filled 2015-08-28: qty 5

## 2015-08-28 MED ORDER — OXYTOCIN 40 UNITS IN LACTATED RINGERS INFUSION - SIMPLE MED
1.0000 m[IU]/min | INTRAVENOUS | Status: DC
  Administered 2015-08-28: 1 m[IU]/min via INTRAVENOUS
  Filled 2015-08-28: qty 1000

## 2015-08-28 MED ORDER — OXYCODONE-ACETAMINOPHEN 5-325 MG PO TABS: 2.0000 | ORAL_TABLET | ORAL | Status: DC | PRN

## 2015-08-28 NOTE — H&P (Signed)
Devina Bezold is a 34 y.o. female presenting for evaluation of gestational hypertension. Admitted last week for observation of BP. During admission no BP treatment needed. Labs were OK. Today in office BP=148/100 and DTR 3+ with 2 beats of clonus. U/S in office today Vtx, BPP 8/8, EFW 8#1oz, AFI 53%. Maternal Medical History:  Fetal activity: Perceived fetal activity is normal.      OB History    Gravida Para Term Preterm AB TAB SAB Ectopic Multiple Living   3 2 2  0 0 0 0 0 0 2     Past Medical History  Diagnosis Date  . Medical history non-contributory    Past Surgical History  Procedure Laterality Date  . No past surgeries     Family History: family history includes Breast cancer in her paternal grandmother; Depression in her mother; Leukemia in her maternal grandmother; Lung cancer in her maternal grandfather; Renal cancer in her paternal grandfather; Stroke (age of onset: 84) in her mother; Testicular cancer (age of onset: 80) in her father. Social History:  reports that she has never smoked. She has never used smokeless tobacco. She reports that she drinks alcohol. She reports that she does not use illicit drugs.   Prenatal Transfer Tool  Maternal Diabetes: No Genetic Screening: Normal Maternal Ultrasounds/Referrals: Normal Fetal Ultrasounds or other Referrals:  None Maternal Substance Abuse:  No Significant Maternal Medications:  None Significant Maternal Lab Results:  None Other Comments:  None  Review of Systems  Eyes: Negative for blurred vision.  Gastrointestinal: Negative for abdominal pain.  Neurological: Negative for headaches.      Blood pressure 159/92, pulse 92, temperature 98.8 F (37.1 C), temperature source Oral, resp. rate 18. Maternal Exam:  Uterine Assessment: Contraction strength is moderate.  Contraction frequency is irregular.   Abdomen: Fetal presentation: vertex     Fetal Exam Fetal State Assessment: Category I - tracings are  normal.     Physical Exam  Cardiovascular: Normal rate and regular rhythm.   Respiratory: Effort normal and breath sounds normal.  GI: Soft. There is tenderness.  Neurological:  DTR 3+ with 2 beats of clonus    Prenatal labs: ABO, Rh:   Antibody:   Rubella:   RPR: Nonreactive (03/18 0000)  HBsAg:    HIV: Non-reactive (03/18 0000)  GBS:     Assessment/Plan: 34 yo G3P2 @ 36 3/7 weeks-BP in MAU 140-160s/98-106, labs OK D/W Dr Lisbeth Renshaw, MFM, who agrees with delivery based on severe range BPs and clonus Will give magnesium sulfate D/W patient above, all questions answered, we discussed possible prematurity issues She states she understands and agrees    Kamyah Wilhelmsen II,Ronne Savoia E 08/28/2015, 6:00 PM

## 2015-08-28 NOTE — MAU Provider Note (Signed)
History     CSN: [id number]  Arrival date and time: 08/28/15 1559   First Provider Initiated Contact with Patient 08/28/15 1710      Chief Complaint  Patient presents with  . Hypertension   HPI   [name] is a 34 y.o. female G3P2002 at 88w3dpresenting for preeclampsia labs. She has mild preeclampsia. She was sent over from the office for labs and serial BP readings.   OB History    Gravida Para Term Preterm AB TAB SAB Ectopic Multiple Living   _0 0 0 0 0 0 0 2      Past Medical History  Diagnosis Date  . Medical history non-contributory     Past Surgical History  Procedure Laterality Date  . No past surgeries      Family History  Problem Relation Age of Onset  . Depression Mother   . Stroke Mother 572   ruptured brain aneyrsm  . Testicular cancer Father 416 . Leukemia Maternal Grandmother   . Lung cancer Maternal Grandfather   . Breast cancer Paternal Grandmother   . Renal cancer Paternal Grandfather     Social History  Substance Use Topics  . Smoking status: Never Smoker   . Smokeless tobacco: Never Used  . Alcohol Use: 0.0 oz/week    0 Standard drinks or equivalent per week     Comment: on occasion    Allergies: No Known Allergies  Prescriptions prior to admission  Medication Sig Dispense Refill Last Dose  . calcium carbonate (TUMS - DOSED IN MG ELEMENTAL CALCIUM) 500 MG chewable tablet Chew 1 tablet by mouth as needed for indigestion.   08/27/2015 at Unknown time  . Prenatal Vit-Fe Fumarate-FA (PRENATAL MULTIVITAMIN) TABS tablet Take 1 tablet by mouth daily at 12 noon.   08/27/2015 at Unknown time  . acetaminophen (TYLENOL) 325 MG tablet Take 650 mg by mouth every 6 (six) hours as needed for mild pain.   Not Taking at Unknown time   Results for orders placed or performed during the hospital encounter of 08/28/15 (from the past 48 hour(s))  Urinalysis, [facility] w reflex microscopic [facility]     Status: Abnormal   Collection Time:  08/28/15  4:20 PM  Result Value Ref Range   Color, Urine YELLOW YELLOW   APPearance HAZY (A) CLEAR   Specific Gravity, Urine 1.020 1.005 - 1.030   pH 5.5 5.0 - 8.0   Glucose, UA NEGATIVE NEGATIVE mg/dL   Hgb urine dipstick NEGATIVE NEGATIVE   Bilirubin Urine NEGATIVE NEGATIVE   Ketones, ur 15 (A) NEGATIVE mg/dL   Protein, ur NEGATIVE NEGATIVE mg/dL   Urobilinogen, UA 0.2 0.0 - 1.0 mg/dL   Nitrite NEGATIVE NEGATIVE   Leukocytes, UA SMALL (A) NEGATIVE  Protein / creatinine ratio, urine     Status: None   Collection Time: 08/28/15  4:20 PM  Result Value Ref Range   Creatinine, Urine 141.00 mg/dL   Total Protein, Urine 19 mg/dL    Comment: NO NORMAL RANGE ESTABLISHED FOR THIS TEST   Protein Creatinine Ratio 0.13 0.00 - 0.15 mg/mg[Cre]  Urine microscopic-add on     Status: Abnormal   Collection Time: 08/28/15  4:20 PM  Result Value Ref Range   Squamous Epithelial / LPF MANY (A) RARE   WBC, UA 0-2 <3 WBC/hpf   RBC / HPF 0-2 <3 RBC/hpf   Bacteria, UA FEW (A) RARE  CBC     Status: Abnormal   Collection Time:  08/28/15  4:56 PM  Result Value Ref Range   WBC 11.6 (H) 4.0 - 10.5 K/uL   RBC 4.15 3.87 - 5.11 MIL/uL   Hemoglobin 11.9 (L) 12.0 - 15.0 g/dL   HCT 36.2 36.0 - 46.0 %   MCV 87.2 78.0 - 100.0 fL   MCH 28.7 26.0 - 34.0 pg   MCHC 32.9 30.0 - 36.0 g/dL   RDW 15.0 11.5 - 15.5 %   Platelets 170 150 - 400 K/uL  Comprehensive metabolic panel     Status: Abnormal   Collection Time: 08/28/15  4:56 PM  Result Value Ref Range   Sodium 136 135 - 145 mmol/L   Potassium 3.9 3.5 - 5.1 mmol/L   Chloride 107 101 - 111 mmol/L   CO2 20 (L) 22 - 32 mmol/L   Glucose, Bld 83 65 - 99 mg/dL   BUN 8 6 - 20 mg/dL   Creatinine, Ser 0.64 0.44 - 1.00 mg/dL   Calcium 9.1 8.9 - 10.3 mg/dL   Total Protein 7.1 6.5 - 8.1 g/dL   Albumin 3.2 (L) 3.5 - 5.0 g/dL   AST 26 15 - 41 U/L   ALT 13 (L) 14 - 54 U/L   Alkaline Phosphatase 118 38 - 126 U/L   Total Bilirubin 0.6 0.3 - 1.2 mg/dL   GFR calc non Af  Amer >60 >60 mL/min   GFR calc Af Amer >60 >60 mL/min    Comment: (NOTE) The eGFR has been calculated using the CKD EPI equation. This calculation has not been validated in all clinical situations. eGFR's persistently <60 mL/min signify possible Chronic Kidney Disease.    Anion gap 9 5 - 15  Uric acid     Status: None   Collection Time: 08/28/15  4:56 PM  Result Value Ref Range   Uric Acid, Serum 4.0 2.3 - 6.6 mg/dL  Lactate dehydrogenase     Status: None   Collection Time: 08/28/15  4:56 PM  Result Value Ref Range   LDH 182 98 - 192 U/L    Review of Systems  Constitutional: Negative for fever.  Eyes: Negative for blurred vision.  Gastrointestinal: Negative for abdominal pain.  Neurological: Negative for headaches.   Physical Exam   Blood pressure 152/103, pulse 103, temperature 98.8 F (37.1 C), temperature source Oral, resp. rate 18.  Patient Vitals for the past 24 hrs:  BP Temp Temp src Pulse Resp  08/28/15 1720 (!) 153/104 mmHg - - 104 -  08/28/15 1710 146/98 mmHg - - 100 -  08/28/15 1701 (!) 152/103 mmHg - - 103 -  08/28/15 1640 (!) 144/103 mmHg - - 104 -  08/28/15 1631 (!) 161/106 mmHg - - 101 -  08/28/15 1624 (!) 157/102 mmHg 98.8 F (37.1 C) Oral 99 18    Physical Exam  Constitutional: She is oriented to person, place, and time. She appears well-developed and well-nourished. No distress.  HENT:  Head: Normocephalic.  Eyes: Pupils are equal, round, and reactive to light.  Neck: Neck supple.  Cardiovascular: Normal rate.   Respiratory: Effort normal and breath sounds normal. No respiratory distress.  GI: Soft. There is no tenderness.  Musculoskeletal: Normal range of motion. She exhibits no edema.  Negative clonus   Neurological: She is alert and oriented to person, place, and time. She displays abnormal reflex (3+ bilaterally ).  Skin: Skin is warm. She is not diaphoretic.  Psychiatric: Her behavior is normal.   Fetal Tracing: Baseline: 135 bpm   Variability:  moderate  Accelerations: 15x15 Decelerations: quick variables  Toco: Q2-3 mins apart; irregular.    MAU Course  Procedures  None  MDM Dr. Gaetano Net notified of lab results and serial BP results at 1745; Dr. Gaetano Net to come to MAU to discuss delivery.   Assessment and Plan   A:  Preeclampsia; approaching severe range BP readings.   P:  Dr. Gaetano Net to MAU to discussed admission.    Lezlie Lye, NP 08/28/2015 5:49 PM

## 2015-08-28 NOTE — Progress Notes (Signed)
BP stable Labs OK FHT cat one UCs q2-4 min Cx 3/50/-2/vtx AROM clear Magnesium sulfate bolus in D/W patient above

## 2015-08-28 NOTE — Progress Notes (Signed)
BP stable Cx 4/70/-2 Pitocin on  UCs q2-3 min IUPC placed

## 2015-08-28 NOTE — MAU Note (Signed)
Sent from office for further evaluation of elevated BP

## 2015-08-29 ENCOUNTER — Encounter (HOSPITAL_COMMUNITY): Payer: Self-pay | Admitting: *Deleted

## 2015-08-29 LAB — COMPREHENSIVE METABOLIC PANEL
ALK PHOS: 106 U/L (ref 38–126)
ALT: 11 U/L — AB (ref 14–54)
AST: 26 U/L (ref 15–41)
Albumin: 2.6 g/dL — ABNORMAL LOW (ref 3.5–5.0)
Anion gap: 10 (ref 5–15)
BUN: 7 mg/dL (ref 6–20)
CALCIUM: 7.2 mg/dL — AB (ref 8.9–10.3)
CO2: 21 mmol/L — AB (ref 22–32)
CREATININE: 0.73 mg/dL (ref 0.44–1.00)
Chloride: 101 mmol/L (ref 101–111)
Glucose, Bld: 177 mg/dL — ABNORMAL HIGH (ref 65–99)
Potassium: 4 mmol/L (ref 3.5–5.1)
Sodium: 132 mmol/L — ABNORMAL LOW (ref 135–145)
Total Bilirubin: 0.3 mg/dL (ref 0.3–1.2)
Total Protein: 5.8 g/dL — ABNORMAL LOW (ref 6.5–8.1)

## 2015-08-29 LAB — CBC
HCT: 33.1 % — ABNORMAL LOW (ref 36.0–46.0)
HEMOGLOBIN: 10.9 g/dL — AB (ref 12.0–15.0)
MCH: 28.5 pg (ref 26.0–34.0)
MCHC: 32.9 g/dL (ref 30.0–36.0)
MCV: 86.6 fL (ref 78.0–100.0)
Platelets: 174 10*3/uL (ref 150–400)
RBC: 3.82 MIL/uL — AB (ref 3.87–5.11)
RDW: 15.1 % (ref 11.5–15.5)
WBC: 19.9 10*3/uL — ABNORMAL HIGH (ref 4.0–10.5)

## 2015-08-29 LAB — URIC ACID: URIC ACID, SERUM: 4 mg/dL (ref 2.3–6.6)

## 2015-08-29 LAB — RPR: RPR Ser Ql: NONREACTIVE

## 2015-08-29 LAB — ABO/RH: ABO/RH(D): O POS

## 2015-08-29 MED ORDER — LACTATED RINGERS IV SOLN
INTRAVENOUS | Status: DC
  Administered 2015-08-29: 12:00:00 via INTRAVENOUS

## 2015-08-29 MED ORDER — SENNOSIDES-DOCUSATE SODIUM 8.6-50 MG PO TABS
2.0000 | ORAL_TABLET | ORAL | Status: DC
  Administered 2015-08-29: 2 via ORAL
  Filled 2015-08-29: qty 2

## 2015-08-29 MED ORDER — OXYCODONE-ACETAMINOPHEN 5-325 MG PO TABS: 2.0000 | ORAL_TABLET | ORAL | Status: DC | PRN

## 2015-08-29 MED ORDER — LANOLIN HYDROUS EX OINT: TOPICAL_OINTMENT | CUTANEOUS | Status: DC | PRN

## 2015-08-29 MED ORDER — OXYCODONE-ACETAMINOPHEN 5-325 MG PO TABS: 1.0000 | ORAL_TABLET | ORAL | Status: DC | PRN

## 2015-08-29 MED ORDER — ZOLPIDEM TARTRATE 5 MG PO TABS
5.0000 mg | ORAL_TABLET | Freq: Every evening | ORAL | Status: DC | PRN
Start: 2015-08-29 — End: 2015-08-31

## 2015-08-29 MED ORDER — BENZOCAINE-MENTHOL 20-0.5 % EX AERO
1.0000 "application " | INHALATION_SPRAY | CUTANEOUS | Status: DC | PRN
  Administered 2015-08-29: 1 via TOPICAL
  Filled 2015-08-29 (×2): qty 56

## 2015-08-29 MED ORDER — DIBUCAINE 1 % RE OINT
1.0000 "application " | TOPICAL_OINTMENT | RECTAL | Status: DC | PRN
  Administered 2015-08-29: 1 via RECTAL
  Filled 2015-08-29 (×2): qty 28

## 2015-08-29 MED ORDER — WITCH HAZEL-GLYCERIN EX PADS: 1.0000 "application " | MEDICATED_PAD | CUTANEOUS | Status: DC | PRN

## 2015-08-29 MED ORDER — ACETAMINOPHEN 325 MG PO TABS: 650.0000 mg | ORAL_TABLET | ORAL | Status: DC | PRN

## 2015-08-29 MED ORDER — ONDANSETRON HCL 4 MG PO TABS: 4.0000 mg | ORAL_TABLET | ORAL | Status: DC | PRN

## 2015-08-29 MED ORDER — TETANUS-DIPHTH-ACELL PERTUSSIS 5-2.5-18.5 LF-MCG/0.5 IM SUSP
0.5000 mL | Freq: Once | INTRAMUSCULAR | Status: DC
  Filled 2015-08-29: qty 0.5

## 2015-08-29 MED ORDER — PRENATAL MULTIVITAMIN CH
1.0000 | ORAL_TABLET | Freq: Every day | ORAL | Status: DC
  Administered 2015-08-29 – 2015-08-31 (×3): 1 via ORAL
  Filled 2015-08-29 (×3): qty 1

## 2015-08-29 MED ORDER — MAGNESIUM SULFATE 50 % IJ SOLN
2.0000 g/h | INTRAVENOUS | Status: DC
  Administered 2015-08-29: 2 g/h via INTRAVENOUS
  Filled 2015-08-29 (×2): qty 80

## 2015-08-29 MED ORDER — IBUPROFEN 600 MG PO TABS
600.0000 mg | ORAL_TABLET | Freq: Four times a day (QID) | ORAL | Status: DC
  Administered 2015-08-29 – 2015-08-31 (×10): 600 mg via ORAL
  Filled 2015-08-29 (×10): qty 1

## 2015-08-29 MED ORDER — SIMETHICONE 80 MG PO CHEW: 80.0000 mg | CHEWABLE_TABLET | ORAL | Status: DC | PRN

## 2015-08-29 MED ORDER — ONDANSETRON HCL 4 MG/2ML IJ SOLN: 4.0000 mg | INTRAMUSCULAR | Status: DC | PRN

## 2015-08-29 MED ORDER — DIPHENHYDRAMINE HCL 25 MG PO CAPS: 25.0000 mg | ORAL_CAPSULE | Freq: Four times a day (QID) | ORAL | Status: DC | PRN

## 2015-08-29 NOTE — Progress Notes (Signed)
Delivery Note At 1:34 AM a viable female was delivered via Vaginal, Spontaneous Delivery (Presentation: ; Occiput Anterior).  APGAR: , ; weight  .   Placenta status: , .  Cord: 3 vessels with the following complications: None.  Cord pH: not done  Anesthesia: None  Episiotomy: None Lacerations: 2nd degree mid line laceration Suture Repair: 2.0 vicryl rapide Est. Blood Loss (mL):  250  Mom to postpartum.  Baby to Couplet care / Skin to Skin.  Lousie Calico II,Quinton Voth E 08/29/2015, 1:52 AM

## 2015-08-29 NOTE — Progress Notes (Signed)
Dr. Matthew Saras notified of patient's VS, denies visual disturbances, headache, epigastric pain, shortness of breath.  +2 DTRs.  No new orders at this time.  Plan to continue the Magnesium until tomorrow morning - no end time given.  Stated to call him if diastolic is greater than 825.  Continue to monitor.

## 2015-08-29 NOTE — Lactation Note (Addendum)
This note was copied from the chart of [place]. Lactation Consultation Note  Patient Name: Christine Rivera Date: 08/29/2015 Reason for consult: Initial assessment;Late preterm infant Baby 28 hours old, LPT, >8lb, been to breast x5 for 5-25 minutes, 2 wets, 2 BM per Dad. Mom reports feeding are going well. Visitors in the room and mom reports baby just fed. Discussed LPT behaviors and possible need to pump/supplement. Mom reports that she has low supply/latch issues with oldest child and with 2nd child supply went down after returning to work. 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 Given LPT handout and lactation brochure for mom to review. Ask mom to call for LC to observe a feeding tonight.   Maternal Data Has patient been taught Hand Expression?:  (reviewed, mom learned w/previous babies)  Feeding Feeding Type: Breast Fed Length of feed: 5 min  LATCH Score/Interventions                      Lactation Tools Discussed/Used     Consult Status Consult Status: Follow-up Date: 08/29/15 Follow-up type: In-patient    Denzil Hughes 08/29/2015, 7:14 PM

## 2015-08-29 NOTE — Lactation Note (Signed)
This note was copied from the chart of [place]. Lactation Consultation Note Follow up visit at 20 hours of age.  Mom reports baby is showing feeding cues, but she wants help getting baby into position.  Assisted with football hold on right breast.  Mom has adequate everted nipples with colostrum expressed.  Baby latches well with wide mouth and flanged lips.  Stimulation needed to keep baby feeding.  Discussed basics of breastfeeding.  Baby is still latched after visit.  Mom to call for assist as needed.    Patient Name: Christine Rivera PRXYV'O Date: 08/29/2015 Reason for consult: Follow-up assessment   Maternal Data Has patient been taught Hand Expression?:  (reviewed, mom learned w/previous babies)  Feeding Feeding Type: Breast Fed Length of feed:  (observed several minutes)  LATCH Score/Interventions Latch: Repeated attempts needed to sustain latch, nipple held in mouth throughout feeding, stimulation needed to elicit sucking reflex. Intervention(s): Adjust position;Assist with latch;Breast massage;Breast compression  Audible Swallowing: A few with stimulation Intervention(s): Skin to skin;Hand expression  Type of Nipple: Everted at rest and after stimulation  Comfort (Breast/Nipple): Soft / non-tender     Hold (Positioning): Assistance needed to correctly position infant at breast and maintain latch. Intervention(s): Breastfeeding basics reviewed;Support Pillows;Position options;Skin to skin  LATCH Score: 7  Lactation Tools Discussed/Used     Consult Status Consult Status: Follow-up Date: 08/30/15 Follow-up type: In-patient    Justice Britain 08/29/2015, 9:52 PM

## 2015-08-29 NOTE — Progress Notes (Signed)
UR chart review completed.  

## 2015-08-30 NOTE — Progress Notes (Signed)
Post Partum Day 1 Subjective: no complaints, up ad lib, voiding and tolerating PO.  No HA, CP/SOB, RUQ pain, or visual disturbance.  Objective: Blood pressure 140/78, pulse 108, temperature 97.9 F (36.6 C), temperature source Oral, resp. rate 20, height 5\' 7"  (1.702 m), weight 206 lb (93.441 kg), SpO2 99 %, unknown if currently breastfeeding.  Physical Exam:  General: alert, cooperative and appears stated age Lochia: appropriate Uterine Fundus: firm Incision: n/a DVT Evaluation: No evidence of DVT seen on physical exam. Negative Homan's sign. No cords or calf tenderness. Ext: brisk reflexes, no clonus   Recent Labs  08/28/15 1656 08/29/15 0713  HGB 11.9* 10.9*  HCT 36.2 33.1*  Pre-eclampsia labs wnl   Assessment/Plan: Plan for discharge tomorrow and Circumcision prior to discharge.  Patient counseled for risk of bleeding, infection, and scarring.  All questions were answered and will proceed. Severe pre-eclampsia-D/C Mag this AM.  Monitor BPs and treat prn.   LOS: 2 days   Larissa Pegg 08/30/2015, 9:39 AM

## 2015-08-31 MED ORDER — IBUPROFEN 600 MG PO TABS: 600.0000 mg | ORAL_TABLET | Freq: Four times a day (QID) | ORAL | Status: DC

## 2015-08-31 NOTE — Discharge Summary (Signed)
Obstetric Discharge Summary Reason for Admission: induction of labor Prenatal Procedures: ultrasound Intrapartum Procedures: spontaneous vaginal delivery Postpartum Procedures: none Complications-Operative and Postpartum: 2 degree perineal laceration HEMOGLOBIN  Date Value Ref Range Status  08/29/2015 10.9* 12.0 - 15.0 [name] Final  03/03/2015 13.3 [name] Final   HCT  Date Value Ref Range Status  08/29/2015 33.1* 36.0 - 46.0 % Final  03/03/2015 40 % Final    Physical Exam:  General: alert and cooperative Lochia: appropriate Uterine Fundus: firm Incision: healing well DVT Evaluation: No evidence of DVT seen on physical exam. Negative Homan's sign. No cords or calf tenderness. No significant calf/ankle edema.  Discharge Diagnoses: Term Pregnancy-delivered  Discharge Information: Date: 08/31/2015 Activity: pelvic rest Diet: routine Medications: PNV and Ibuprofen Condition: stable Instructions: refer to practice specific booklet Discharge to: home   Newborn Data: Live born female  Birth Weight: 8 lb 2.7 oz (3705 Rivera) APGAR: 8, 9  Home with mother.  Christine Rivera,Christine Rivera 08/31/2015, 8:07 AM

## 2015-08-31 NOTE — Progress Notes (Signed)
Post Partum Day 2 Subjective: no complaints, up ad lib, voiding, tolerating PO, + flatus and denies HA, RUQ pain  Objective: Blood pressure 123/75, pulse 83, temperature 98.6 F (37 C), temperature source Oral, resp. rate 20, height 5\' 7"  (1.702 m), weight 199 lb (90.266 kg), SpO2 87 %, unknown if currently breastfeeding.  Physical Exam:  General: alert and cooperative Lochia: appropriate Uterine Fundus: firm Incision: healing well DVT Evaluation: No evidence of DVT seen on physical exam. Negative Homan's sign. No cords or calf tenderness. No significant calf/ankle edema.   Recent Labs  08/28/15 1656 08/29/15 0713  HGB 11.9* 10.9*  HCT 36.2 33.1*    Assessment/Plan: Discharge home   LOS: 3 days   CURTIS,CAROL G 08/31/2015, 7:59 AM

## 2015-09-01 ENCOUNTER — Ambulatory Visit: Payer: Self-pay

## 2015-09-01 NOTE — Lactation Note (Signed)
This note was copied from the chart of [place]. Lactation Consultation Note  Met with mom for F/U prior to D/C home. Mom reports that baby is BF feeding well and taking his supplement well. He is taking up to 40 cc via bottle. Baby is feeing Q 3 hours and mom aware that he still needs to eat at least Q 3 hours. Mom says he wakes up every 2. 5- 3 hours on his own. Mom is pumping pc. She says she is getting a small amount BM but is not giving it to baby since it is after he feeds, enc her to save and feed at next feeding. Mom is aware if supply and demand and is comfortable with their schedule and wants to keep giving supplement due to weight loss and hyperbilirubianemia. Baby to go home on single phototherapy with follow up in home bilirubin and weight check on Sunday. Mom says her breast are filling but BM not in yet. She has a Medela DEBP at home and plans to continue pumping. She knows when milk comes in baby may decrease need for supplement if feeding well. She is aware of OP LC services and has phone #, she has used OP services previously. She is aware of Support Groups and plans to attend. She is also aware of BF Resources. Told to call PRN questions/concerns. Follow up OP Feeding Assessment made for 09/08/15 @ 10:30 am  Patient Name: Boy Simone Rodenbeck [id number] Date: 09/01/2015 Reason for consult: Follow-up assessment;Hyperbilirubinemia;Late preterm infant   Maternal Data    Feeding Feeding Type: Breast Milk with Formula added Nipple Type: Slow - flow Length of feed: 30 min  LATCH Score/Interventions Latch: Grasps breast easily, tongue down, lips flanged, rhythmical sucking. Intervention(s): Adjust position  Audible Swallowing: A few with stimulation Intervention(s): Skin to skin Intervention(s): Hand expression  Type of Nipple: Everted at rest and after stimulation  Comfort (Breast/Nipple): Soft / non-tender     Hold (Positioning): No assistance needed to correctly position  infant at breast. Intervention(s): Breastfeeding basics reviewed;Support Pillows;Position options;Skin to skin  LATCH Score: 9  Lactation Tools Discussed/Used     Consult Status Consult Status: Complete Date: 09/01/15    Debby Freiberg Hice 09/01/2015, 11:28 AM

## 2015-09-08 ENCOUNTER — Ambulatory Visit (HOSPITAL_COMMUNITY)
Admission: RE | Admit: 2015-09-08 | Discharge: 2015-09-08 | Disposition: A | Discharge status: Home / Self Care | Payer: 59 | Source: Ambulatory Visit | Attending: Obstetrics and Gynecology | Admitting: Obstetrics and Gynecology

## 2015-09-08 NOTE — Lactation Note (Addendum)
Lactation Consult  Mother's reason for visit:  [name] 36.4 weeks gest at birth, here for scheduled f/u for feeding assessment post d/c on 09/01/15. Baby home on single photo therapy.  Visit Type:  Outpatient - Feeding assessment Appointment Notes:  Mom reports [name] is BF well, sleepy at the breast. Mom is pumping and supplementing after feedings with EBM/formula. Phototherapy d/c'd Sunday, 09/03/15. [name] now 11 days old.   Consult:  Initial Lactation Consultant:  Katrine Coho  ________________________________________________________________________   37 Name: Christine Rivera Date of Birth: 08/29/2017 Pediatrician: Dr. Hans Eden Gender: female Gestational Age: [redacted]w[redacted]d (At Birth) Birth Weight: 8 lb 2.7 oz (3705 g) Weight at Discharge: Weight: 7 lb 6.5 oz (3360 g)Date of Discharge: 09/01/2015 Filed Weights   08/30/15 0008 08/31/15 0044 09/01/15 0000  Weight: 7 lb 11.5 oz (3500 g) 7 lb 6.5 oz (3360 g) 7 lb 6.5 oz (3360 g)   Last weight taken from location outside of [facility]: 09/05/15 7 lb. 10.0 oz. Location:Pediatrician's office Weight today: 7 lb. 13.2 oz/3548 gm       ________________________________________________________________________  Mother's Name: Christine Rivera Type of delivery:  SVB Breastfeeding Experience:  P3, 1st Baby LMS, 2nd baby Better Milk supply but difficulty to keep up milk supply with pumping when returning to work. 2nd Baby BF 4 months Maternal Medical Conditions:  Pregnancy induced hypertension Maternal Medications:  PNV  ________________________________________________________________________  Breastfeeding History (Post Discharge)  Frequency of breastfeeding:  Every 3 hours Duration of feeding:  30-45 minutes, sleepy at breast  Supplementation  Formula:  Volume 30-60 ml Frequency:  4-5 times/day       Brand: Enfamil  Breastmilk:  Volume 20-30 ml Frequency:  4  times/day   Method:  Bottle   Pumping:  Medela PNS DEBP 4-5 times/day receiving 20-30 ml after nursing  Infant Intake and Output Assessment  Voids:  8 in 24 hrs.  Color:  Clear yellow Stools:  3 in 24 hrs.  Color:  Yellow/mustard - seedy  ________________________________________________________________________  Maternal Breast Assessment  Breast:  Soft Nipple:  Erect Pain level:  0  _______________________________________________________________________ Feeding Assessment/Evaluation  Initial feeding assessment:  Infant's oral assessment:  Variance. Baby is noted to have short, thick labial frenulum along with short, posterior lingual frenulum. Sucking callous' along lower lip and at upper lip. Some tongue humping with suck exam.  Positioning:  Cradle, changed to cross cradle for more depth Left breast  LATCH documentation:  Latch:  2 = Grasps breast easily, tongue down, lips flanged, rhythmical sucking.  Upper lip needed to be un-tucked.  Audible swallowing:  1 = A few with stimulation  Type of nipple:  2 = Everted at rest and after stimulation  Comfort (Breast/Nipple):  2 = Soft / non-tender  Hold (Positioning):  1 = Assistance needed to correctly position infant at breast and maintain latch  LATCH score:  8  Attached assessment:  Assisted Mom to obtain good depth with latch and to sustain depth while nursing. Baby will become shallow with some non-nutritive suckling observed.   Lips flanged:  Yes.    Lips untucked:  Yes.  Upper lip needed adjustment.   Suck assessment:  Displays both nutritive and non-nutritive suckling.   Tools:  Bottle Instructed on use and cleaning of tool:  Yes.    Pre-feed weight:  3548 g  (7 lb. 13.2 oz.) Post-feed weight:  3554 g (7 lb. 13.4 oz.) Amount transferred:  6 ml with nursing from left breast  for 15 minutes  Additional Feeding Assessment -   Infant's oral assessment:  Variance  Positioning:  Football Right breast  LATCH  documentation:  Latch:  2 = Grasps breast easily, tongue down, lips flanged, rhythmical sucking.  Audible swallowing:  2 = Spontaneous and intermittent  Type of nipple:  2 = Everted at rest and after stimulation  Comfort (Breast/Nipple):  2 = Soft / non-tender  Hold (Positioning):  1 = Assistance needed to correctly position infant at breast and maintain latch  LATCH score:  9  Attached assessment:  Deep  Lips flanged:  Yes.    Lips untucked:  Yes.    Suck assessment:  Nutritive suckling observed.   Tools:  Bottle Instructed on use and cleaning of tool:  Yes.    Pre-feed weight:  3554 g  (7 lb. 13.4 oz.) Post-feed weight:  3566 g (7 lb. 13.8 oz.) Amount transferred:  12 ml  With nursing from right breast for 23 minutes. Amount supplemented:  33 ml  EBM   Total amount pumped post feed:  Approx 10 ml  Total amount transferred:  18 ml Total supplement given:  33 ml  Reviewed with Mom how to assess for deep latch so baby can maximize milk transfer. Baby sleepy at the breast, reviewed with Mom ways to stimulate. Mom is experienced BF and has already initiated many of the interventions to support milk supply and be sure baby is getting enough calories. Baby is gaining an adequate amount of weight per weight check today and [facility] weight check at [facility] on Tuesday, 09/05/15. Advised to continue to BF with each feeding. Due to small amount of milk transfer and small amount received with pumping, advised Mom to pump after each feeding during the day/evening and continue to supplement with 40 ml or more of EBM/formula with each feeding.  Information given on supplements to start to support milk production - Fenugreek or Lactation Support or More Milk Plus by [employer]. Discussed frenulum and affect on milk transfer. Advised Mom as baby matures and becomes more nutritive at the breast her milk supply and his ability to transfer milk should improve. If this does not happen it may be due to the short  labial/lingual frenulum. Mom happy with BF and supplementing and reports at this visit would probably not request referral for this.  OP Lactation f/u scheduled for Thursday, 09/21/15 at 10:30 pm.

## 2015-09-21 ENCOUNTER — Ambulatory Visit (HOSPITAL_COMMUNITY): Payer: 59

## 2016-11-09 IMAGING — US US MFM OB COMP +14 WKS
1 series · 14 of 28 positions shown · non-contrast
Comparison: none

[Series 1: us mfm ob comp +14 wks · 14 of 61 slices shown]
[im 3/61]
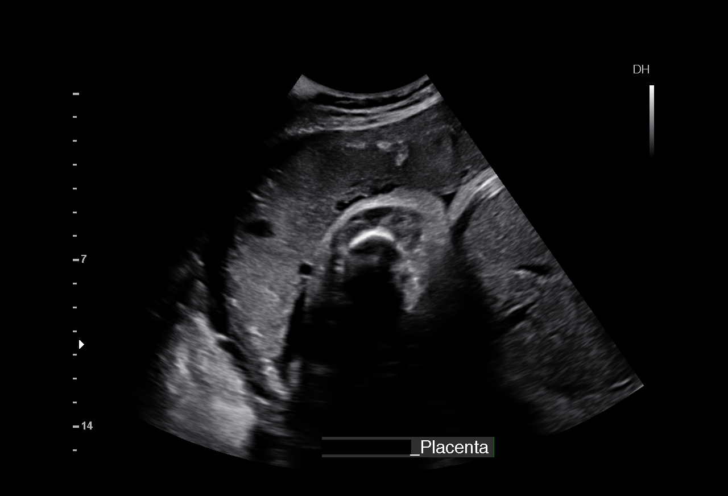
[im 7/61]
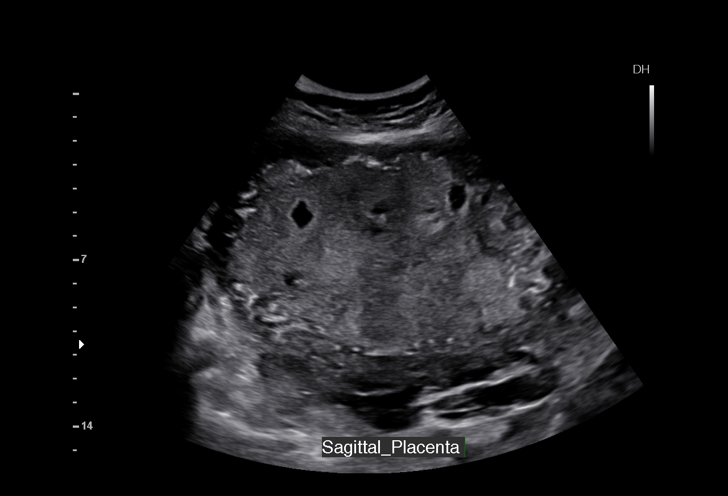
[im 12/61]
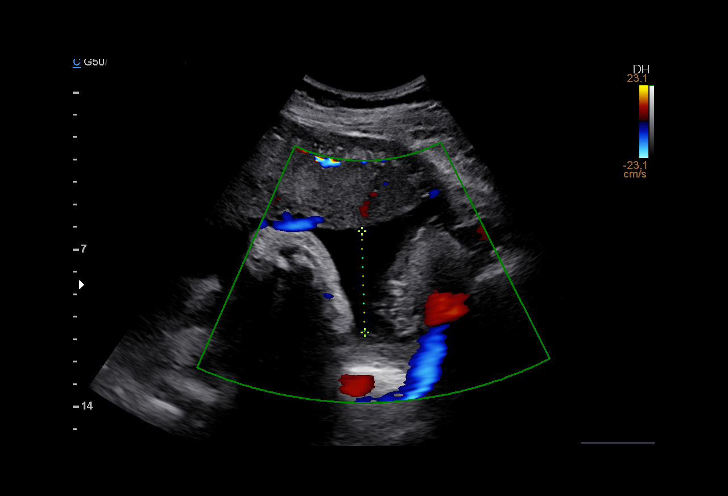
[im 16/61]
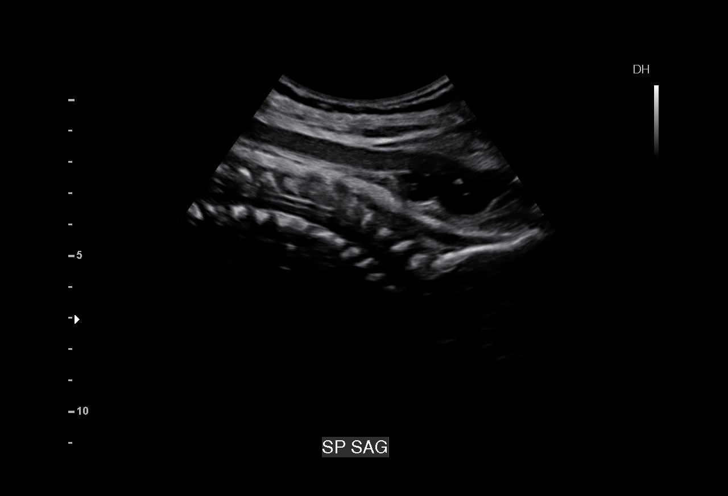
[im 21/61]
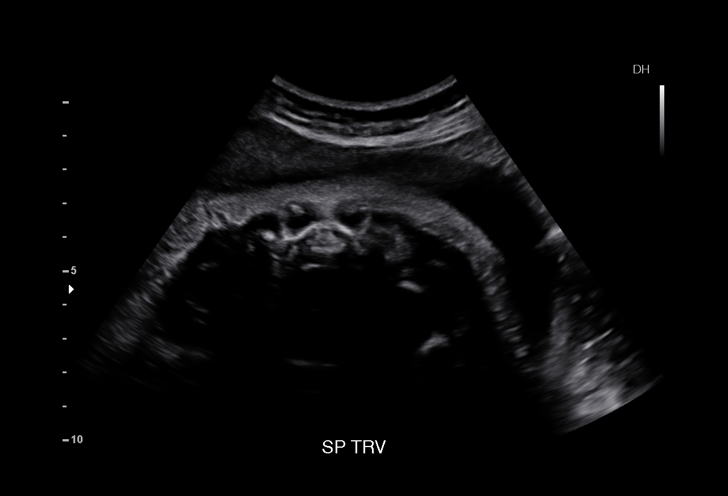
[im 25/61]
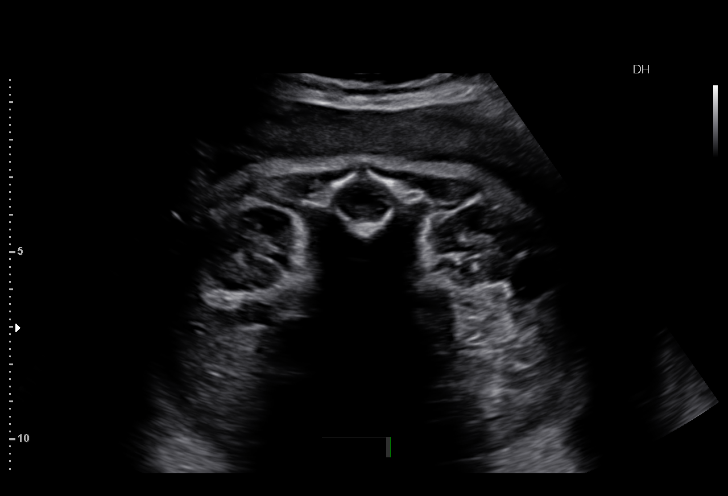
[im 29/61]
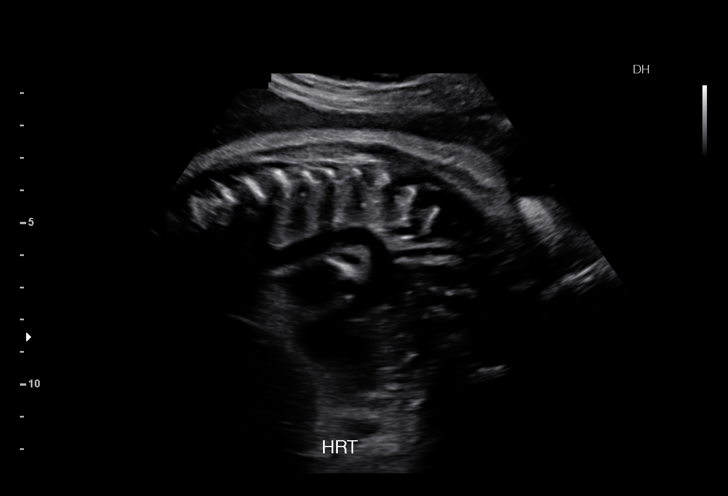
[im 34/61]
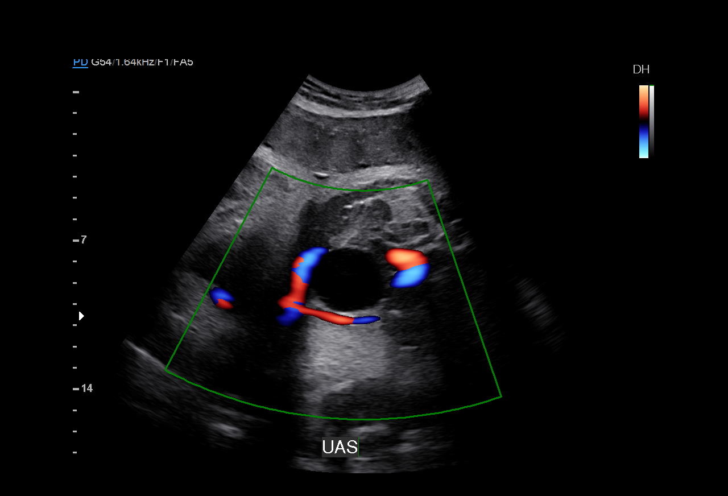
[im 38/61]
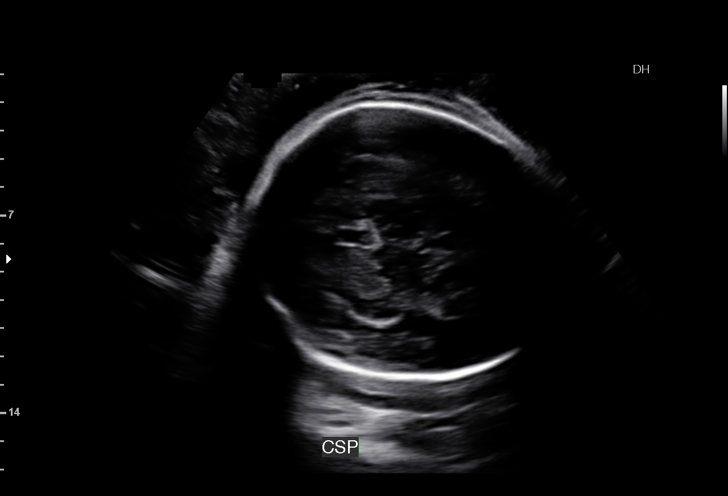
[im 43/61]
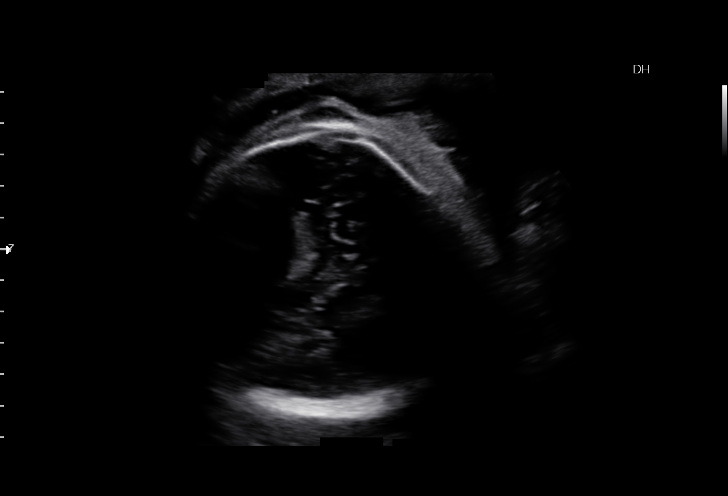
[im 47/61]
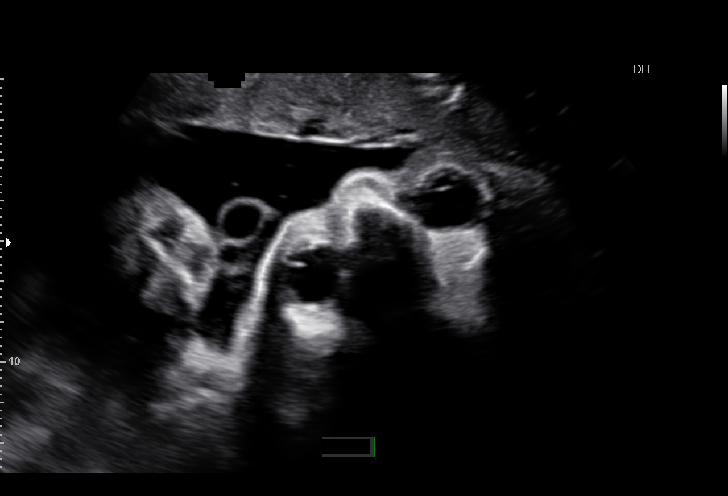
[im 52/61]
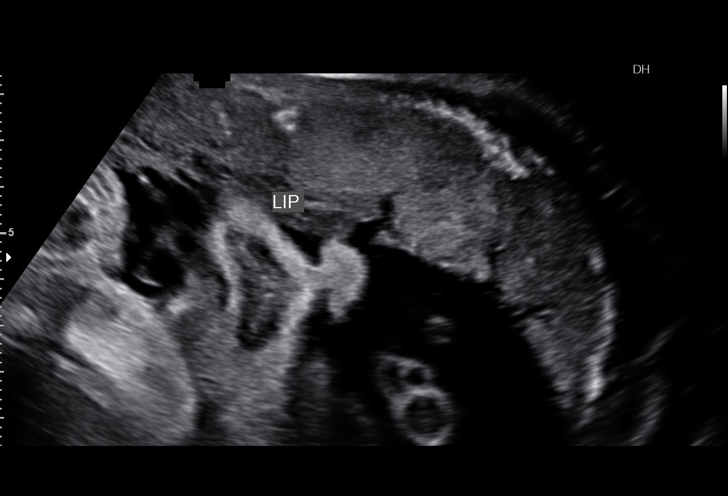
[im 56/61]
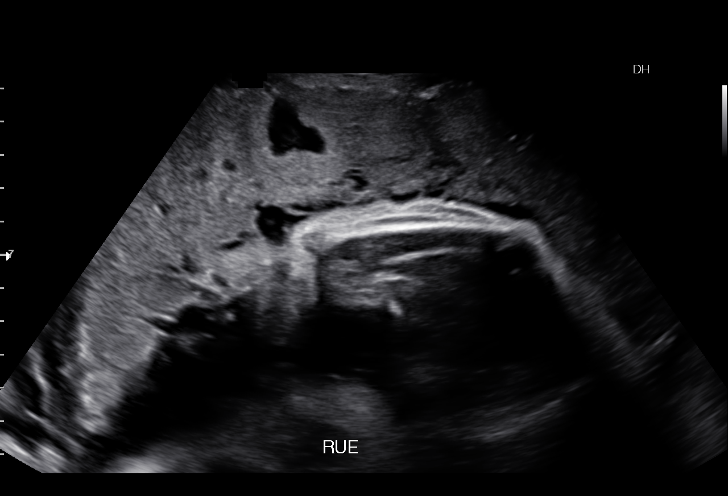
[im 61/61]
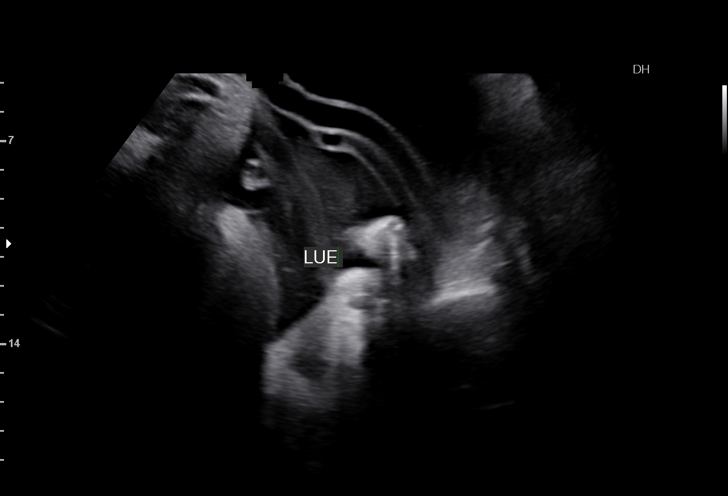

[14 of 28 positions shown; findings below may reference images not displayed]

OBSTETRICS REPORT
(Signed Final 08/23/2015 [DATE])

Service(s) Provided

Indications

35 weeks gestation of pregnancy
Basic anatomic survey                                 Z36
Gestational hypertension without significant
proteinuria, third trimester
Advanced maternal age multigravida (34), third
trimester
Poor obstetric history: Previous gestational HTN
Fetal Evaluation

Num Of Fetuses:    1
Fetal Heart Rate:  133                          bpm
Cardiac Activity:  Observed
Presentation:      Cephalic
Placenta:          Anterior right, above
cervical os
P. Cord            Visualized, central
Insertion:

Amniotic Fluid
AFI FV:      Subjectively within normal limits
AFI Sum:     15.93   cm       58  %Tile      Larg Pckt:   7.24  cm
RUQ:   4.51    cm   RLQ:    7.24   cm    LUQ:   1.26    cm    LLQ:   2.92    cm
Biometry

BPD:     93.1  mm     G. Age:  37w 6d                CI:        74.49   70 - 86
FL/HC:       20.4  20.1 -
22.1
HC:     342.4  mm     G. Age:  39w 3d       94   %   HC/AC:       0.93  0.93 -
1.11
AC:     369.1  mm     G. Age:  40w 6d     > 97   %   FL/BPD:      75.0  71 - 87
FL:      69.8  mm     G. Age:  35w 6d       47   %   FL/AC:       18.9  20 - 24
HUM:     59.7  mm     G. Age:  34w 5d       43   %
CER:     45.3  mm     G. Age:  N/A          82   %

Est. FW:    2814   gm     8 lb 4 oz   > 90  %
Gestational Age
U/S Today:     38w 4d                                        EDD:   09/02/15
Best:          35w 5d     Det. By:  Early Ultrasound         EDD:   09/22/15
(03/01/15)
Anatomy

Cranium:          Appears normal         Aortic Arch:      Appears normal
Fetal Cavum:      Appears normal         Ductal Arch:      Appears normal
Ventricles:       Appears normal         Diaphragm:        Appears normal
Choroid Plexus:   Appears normal         Stomach:          Appears normal, left
sided
Cerebellum:       Appears normal         Abdomen:          Appears normal
Posterior Fossa:  Appears normal         Abdominal Wall:   Not well visualized
Nuchal Fold:      Not applicable (>20    Cord Vessels:     Appears normal (3
wks GA)                                  vessel cord)
Face:             Appears normal         Kidneys:          Appear normal
(orbits and profile)
Lips:             Appears normal         Bladder:          Appears normal
Heart:            Not well visualized    Spine:            Appears normal
RVOT:             Not well visualized    Lower             Not well visualized
Extremities:
LVOT:             Not well visualized    Upper             Not well visualized
Extremities:

Other:  Parents do not wish to know sex of fetus. Technically difficult due to
advanced GA and fetal position.
Targeted Anatomy

Fetal Central Nervous System
Cisterna Magna:
Cervix Uterus Adnexa

Cervix:       Not visualized (advanced GA >39wks)
Impression

SIUP at 83w3d, gestational HTN
no dysmorphic features demonstrated but limited as above
EFW >90th%
AFI is normal
Recommendations

Continue antenatal testing until delivery (NST 2x/wk and
weekly AFI)
Delivery by 37 weeks given GHTN.

questions or concerns.

## 2018-04-24 LAB — RESULTS CONSOLE HPV: CHL HPV: NEGATIVE

## 2018-04-24 LAB — HM PAP SMEAR: HM PAP: NEGATIVE

## 2018-06-26 ENCOUNTER — Ambulatory Visit (INDEPENDENT_AMBULATORY_CARE_PROVIDER_SITE_OTHER): Payer: 59 | Admitting: Family Medicine

## 2018-06-26 ENCOUNTER — Other Ambulatory Visit: Payer: Self-pay

## 2018-06-26 ENCOUNTER — Encounter: Payer: Self-pay | Admitting: Family Medicine

## 2018-06-26 VITALS — BP 130/80 | HR 51 | Temp 98.8°F | Ht 67.25 in | Wt 169.0 lb

## 2018-06-26 DIAGNOSIS — Z0000 Encounter for general adult medical examination without abnormal findings: Secondary | ICD-10-CM | POA: Diagnosis not present

## 2018-06-26 DIAGNOSIS — R030 Elevated blood-pressure reading, without diagnosis of hypertension: Secondary | ICD-10-CM | POA: Diagnosis not present

## 2018-06-26 DIAGNOSIS — I10 Essential (primary) hypertension: Secondary | ICD-10-CM | POA: Insufficient documentation

## 2018-06-26 LAB — COMPREHENSIVE METABOLIC PANEL
ALBUMIN: 4.3 g/dL (ref 3.5–5.2)
ALT: 24 U/L (ref 0–35)
AST: 20 U/L (ref 0–37)
Alkaline Phosphatase: 71 U/L (ref 39–117)
BUN: 7 mg/dL (ref 6–23)
CALCIUM: 9.1 mg/dL (ref 8.4–10.5)
CHLORIDE: 105 meq/L (ref 96–112)
CO2: 26 meq/L (ref 19–32)
Creatinine, Ser: 0.81 mg/dL (ref 0.40–1.20)
GFR: 83.48 mL/min (ref >60.00)
Glucose, Bld: 97 mg/dL (ref 70–99)
POTASSIUM: 4.3 meq/L (ref 3.5–5.1)
SODIUM: 138 meq/L (ref 135–145)
Total Bilirubin: 0.8 mg/dL (ref 0.2–1.2)
Total Protein: 7.3 g/dL (ref 6.0–8.3)

## 2018-06-26 LAB — CBC WITH DIFFERENTIAL/PLATELET
BASOS PCT: 0.5 % (ref 0.0–3.0)
Basophils Absolute: 0 10*3/uL (ref 0.0–0.1)
Eosinophils Absolute: 0.1 10*3/uL (ref 0.0–0.7)
Eosinophils Relative: 1.8 % (ref 0.0–5.0)
HEMATOCRIT: 36.9 % (ref 36.0–46.0)
HEMOGLOBIN: 12.2 g/dL (ref 12.0–15.0)
LYMPHS PCT: 29.8 % (ref 12.0–46.0)
Lymphs Abs: 2 10*3/uL (ref 0.7–4.0)
MCHC: 32.9 g/dL (ref 30.0–36.0)
MCV: 86.3 fl (ref 78.0–100.0)
Monocytes Absolute: 0.5 10*3/uL (ref 0.1–1.0)
Monocytes Relative: 7.5 % (ref 3.0–12.0)
NEUTROS ABS: 4.1 10*3/uL (ref 1.4–7.7)
Neutrophils Relative %: 60.4 % (ref 43.0–77.0)
PLATELETS: 199 10*3/uL (ref 150.0–400.0)
RBC: 4.28 Mil/uL (ref 3.87–5.11)
RDW: 14 % (ref 11.5–15.5)
WBC: 6.7 10*3/uL (ref 4.0–10.5)

## 2018-06-26 LAB — LIPID PANEL
CHOL/HDL RATIO: 3
CHOLESTEROL: 149 mg/dL (ref 0–200)
HDL: 43.5 mg/dL (ref >39.00)
LDL Cholesterol: 86 mg/dL (ref 0–99)
NonHDL: 105.42
Triglycerides: 97 mg/dL (ref 0.0–149.0)
VLDL: 19.4 mg/dL (ref 0.0–40.0)

## 2018-06-26 LAB — T4, FREE: Free T4: 0.82 ng/dL (ref 0.60–1.60)

## 2018-06-26 LAB — TSH: TSH: 1.4 u[IU]/mL (ref 0.35–4.50)

## 2018-06-26 LAB — T3, FREE: T3, Free: 3.5 pg/mL (ref 2.3–4.2)

## 2018-06-26 NOTE — Assessment & Plan Note (Signed)
Eval for secondary cause, risk factors. Work on [facility] healthy lifestyle changes. Encouraged exercise, weight loss, healthy eating habits.

## 2018-06-26 NOTE — Progress Notes (Signed)
Subjective:    Patient ID: Christine Rivera, female    DOB: 1981-02-09, 37 y.o.   MRN: [id number]  HPI The patient is here for annual wellness exam and preventative care.    Last CPX here in 2015  Has had a child delivered in 2016  Sees Dr. Julien Girt yearly.   She  has 3 kids.   Gestational HTN.. Was not on med during pregnancy.  BP remained high  For 1-2 months.  Returned to normal until last OV at GYN   146/93 Now at home in last few weeks 120/70-80s  No family history of HTN. 10 lb weight gain since 2015  no ETOH, nonsmoker Eats out a lot chickfila some.  Exercise: 3 times a week.  Diet: good, healthy diet  Some stress at home with kids.  Social History /Family History/Past Medical History reviewed in detail and updated in EMR if needed. Blood pressure 130/80, pulse (!) 51, temperature 98.8 F (37.1 C), temperature source Oral, height 5' 7.25" (1.708 m), weight 169 lb (76.7 kg), last menstrual period 06/03/2018, unknown if currently breastfeeding.  Review of Systems  Constitutional: Negative for fatigue and fever.  HENT: Negative for congestion.   Eyes: Negative for pain.  Respiratory: Negative for cough and shortness of breath.   Cardiovascular: Negative for chest pain, palpitations and leg swelling.  Gastrointestinal: Negative for abdominal pain.  Genitourinary: Negative for dysuria and vaginal bleeding.  Musculoskeletal: Negative for back pain.  Neurological: Negative for syncope, light-headedness and headaches.  Psychiatric/Behavioral: Negative for dysphoric mood.       Objective:   Physical Exam  Constitutional: Vital signs are normal. She appears well-developed and well-nourished. She is cooperative.  Non-toxic appearance. She does not appear ill. No distress.  HENT:  Head: Normocephalic.  Right Ear: Hearing, tympanic membrane, external ear and ear canal normal.  Left Ear: Hearing, tympanic membrane, external ear and ear canal normal.  Nose: Nose normal.    Eyes: Pupils are equal, round, and reactive to light. Conjunctivae, EOM and lids are normal. Lids are everted and swept, no foreign bodies found.  Neck: Trachea normal and normal range of motion. Neck supple. Carotid bruit is not present. No thyroid mass and no thyromegaly present.  Cardiovascular: Normal rate, regular rhythm, S1 normal, S2 normal, normal heart sounds and intact distal pulses. Exam reveals no gallop.  No murmur heard. Pulmonary/Chest: Effort normal and breath sounds normal. No respiratory distress. She has no wheezes. She has no rhonchi. She has no rales.  Abdominal: Soft. Normal appearance and bowel sounds are normal. She exhibits no distension, no fluid wave, no abdominal bruit and no mass. There is no hepatosplenomegaly. There is no tenderness. There is no rebound, no guarding and no CVA tenderness. No hernia.  Lymphadenopathy:    She has no cervical adenopathy.    She has no axillary adenopathy.  Neurological: She is alert. She has normal strength. No cranial nerve deficit or sensory deficit.  Skin: Skin is warm, dry and intact. No rash noted.  0.2 cm dark uniform skin lesion on right posterior leg... Low risk for melanoma but if changing.. See derm.  Psychiatric: Her speech is normal and behavior is normal. Judgment normal. Her mood appears not anxious. Cognition and memory are normal. She does not exhibit a depressed mood.          Assessment & Plan:  The patient's preventative maintenance and recommended screening tests for an annual wellness exam were reviewed in full today. Brought up to  date unless services declined.  Counselled on the importance of diet, exercise, and its role in overall health and mortality. The patient's FH and SH was reviewed, including their home life, tobacco status, and drug and alcohol status.   Gyn pap and pelvic exam. Plan start mammogram age 48.  Up to date with Tdap 2012 Denies STD screen  Nonsmoker  No ETOH/ no drug use Father  colon cancer age 25s.. Nongenetic.  PGM breast cancer.

## 2018-06-26 NOTE — Patient Instructions (Addendum)
Please stop at the lab to have labs drawn. Keep up the work on healthy eating and regular exercise!

## 2018-07-09 ENCOUNTER — Encounter: Payer: Self-pay | Admitting: Radiology

## 2019-08-17 LAB — HM MAMMOGRAPHY

## 2020-03-13 ENCOUNTER — Emergency Department
Admission: EM | Admit: 2020-03-13 | Discharge: 2020-03-13 | Disposition: A | Discharge status: Home / Self Care | Payer: 59 | Attending: Emergency Medicine | Admitting: Emergency Medicine

## 2020-03-13 ENCOUNTER — Other Ambulatory Visit: Payer: Self-pay

## 2020-03-13 ENCOUNTER — Encounter: Payer: Self-pay | Admitting: Emergency Medicine

## 2020-03-13 DIAGNOSIS — I159 Secondary hypertension, unspecified: Secondary | ICD-10-CM

## 2020-03-13 DIAGNOSIS — I10 Essential (primary) hypertension: Secondary | ICD-10-CM | POA: Insufficient documentation

## 2020-03-13 LAB — COMPREHENSIVE METABOLIC PANEL
ALT: 13 U/L (ref 0–44)
AST: 16 U/L (ref 15–41)
Albumin: 4.3 g/dL (ref 3.5–5.0)
Alkaline Phosphatase: 64 U/L (ref 38–126)
Anion gap: 11 (ref 5–15)
BUN: 15 mg/dL (ref 6–20)
CO2: 24 mmol/L (ref 22–32)
Calcium: 9.4 mg/dL (ref 8.9–10.3)
Chloride: 102 mmol/L (ref 98–111)
Creatinine, Ser: 0.75 mg/dL (ref 0.44–1.00)
GFR calc Af Amer: >60 mL/min (ref >60)
GFR calc non Af Amer: >60 mL/min (ref >60)
Glucose, Bld: 96 mg/dL (ref 70–99)
Potassium: 3.8 mmol/L (ref 3.5–5.1)
Sodium: 137 mmol/L (ref 135–145)
Total Bilirubin: 0.5 mg/dL (ref 0.3–1.2)
Total Protein: 8.1 g/dL (ref 6.5–8.1)

## 2020-03-13 LAB — CBC WITH DIFFERENTIAL/PLATELET
Abs Immature Granulocytes: 0.02 10*3/uL (ref 0.00–0.07)
Basophils Absolute: 0 10*3/uL (ref 0.0–0.1)
Basophils Relative: 0 %
Eosinophils Absolute: 0.3 10*3/uL (ref 0.0–0.5)
Eosinophils Relative: 3 %
HCT: 40.6 % (ref 36.0–46.0)
Hemoglobin: 13 g/dL (ref 12.0–15.0)
Immature Granulocytes: 0 %
Lymphocytes Relative: 25 %
Lymphs Abs: 2.1 10*3/uL (ref 0.7–4.0)
MCH: 26.5 pg (ref 26.0–34.0)
MCHC: 32 g/dL (ref 30.0–36.0)
MCV: 82.9 fL (ref 80.0–100.0)
Monocytes Absolute: 0.5 10*3/uL (ref 0.1–1.0)
Monocytes Relative: 6 %
Neutro Abs: 5.5 10*3/uL (ref 1.7–7.7)
Neutrophils Relative %: 66 %
Platelets: 283 10*3/uL (ref 150–400)
RBC: 4.9 MIL/uL (ref 3.87–5.11)
RDW: 15.5 % (ref 11.5–15.5)
WBC: 8.4 10*3/uL (ref 4.0–10.5)
nRBC: 0 % (ref 0.0–0.2)

## 2020-03-13 LAB — POCT PREGNANCY, URINE: Preg Test, Ur: NEGATIVE

## 2020-03-13 LAB — TROPONIN I (HIGH SENSITIVITY): Troponin I (High Sensitivity): 6 ng/L (ref <18)

## 2020-03-13 LAB — MAGNESIUM: Magnesium: 2.2 mg/dL (ref 1.7–2.4)

## 2020-03-13 MED ORDER — HYDROCHLOROTHIAZIDE 12.5 MG PO TABS: 12.5000 mg | ORAL_TABLET | Freq: Every day | ORAL | 0 refills | Status: DC

## 2020-03-13 NOTE — ED Triage Notes (Signed)
Pt in via POV, complaints of hypertension x one day, denies hx of same.  Reports some generalized tingling to face, which is what prompted her to check her BP at home.  Denies any other complaints.  Ambulatory to triage, NAD noted.

## 2020-03-13 NOTE — ED Provider Notes (Addendum)
[facility] Emergency Department Provider Note  ____________________________________________   First MD Initiated Contact with Patient 03/13/20 1733     (approximate)  I have reviewed the triage vital signs and the nursing notes.   HISTORY  Chief Complaint Hypertension    HPI Christine Rivera is a 39 y.o. female who comes in with concerns for hypertension.  Patient states that her last blood pressure check was a few months ago and she was in the [id number] systolic.  She states that she has a history of preeclampsia but has never needed to be on blood pressure medicine.  Today she felt like her entire face was having a little bit of tingling lasted a few hours and then went away.  She stated that she checked her blood pressure and it was in the [id number] systolic which is why she came to the ER for evaluation.  Currently she denies any headaches, tingling, weakness, aphasia or any other concerns.  Feels at her baseline self.          Past Medical History:  Diagnosis Date  . Medical history non-contributory     Patient Active Problem List   Diagnosis Date Noted  . Elevated blood pressure reading in office without diagnosis of hypertension 06/26/2018  . Routine general medical examination at a health care facility 06/26/2018  . Severe preeclampsia 08/28/2015  . Gestational hypertension 08/23/2015    Past Surgical History:  Procedure Laterality Date  . NO PAST SURGERIES      Prior to Admission medications   Not on File    Allergies Nickel  Family History  Problem Relation Age of Onset  . Depression Mother   . Stroke Mother 31       ruptured brain aneyrsm  . Testicular cancer Father 27  . Leukemia Maternal Grandmother   . Lung cancer Maternal Grandfather   . Breast cancer Paternal Grandmother   . Renal cancer Paternal Grandfather     Social History Social History   Tobacco Use  . Smoking status: Never Smoker  . Smokeless tobacco: Never Used   Substance Use Topics  . Alcohol use: No    Alcohol/week: 0.0 standard drinks  . Drug use: No      Review of Systems Constitutional: No fever/chills Eyes: No visual changes. ENT: No sore throat. Cardiovascular: Denies chest pain. Respiratory: Denies shortness of breath. Gastrointestinal: No abdominal pain.  No nausea, no vomiting.  No diarrhea.  No constipation. Genitourinary: Negative for dysuria. Musculoskeletal: Negative for back pain. Skin: Negative for rash. Neurological: Negative for headaches, focal weakness or numbness.  Positive tingling All other ROS negative ____________________________________________   PHYSICAL EXAM:  VITAL SIGNS: ED Triage Vitals  Enc Vitals Group     BP 03/13/20 1651 (!) 175/115     Pulse Rate 03/13/20 1651 86     Resp 03/13/20 1651 18     Temp 03/13/20 1651 98.6 F (37 C)     Temp Source 03/13/20 1651 Oral     SpO2 03/13/20 1651 100 %     Weight 03/13/20 1650 170 lb (77.1 kg)     Height 03/13/20 1650 5\' 7"  (1.702 m)     Head Circumference --      Peak Flow --      Pain Score 03/13/20 1659 0     Pain Loc --      Pain Edu? --      Excl. in [place]? --     Constitutional: Alert and oriented.  Well appearing and in no acute distress. Eyes: Conjunctivae are normal. EOMI. Head: Atraumatic. Nose: No congestion/rhinnorhea. Mouth/Throat: Mucous membranes are moist.   Neck: No stridor. Trachea Midline. FROM Cardiovascular: Normal rate, regular rhythm. Grossly normal heart sounds.  Good peripheral circulation. Respiratory: Normal respiratory effort.  No retractions. Lungs CTAB. Gastrointestinal: Soft and nontender. No distention. No abdominal bruits.  Musculoskeletal: No lower extremity tenderness nor edema.  No joint effusions. Neurologic: Cranial nerves II through XII are intact, equal strength in arms and legs Skin:  Skin is warm, dry and intact. No rash noted. Psychiatric: Mood and affect are normal. Speech and behavior are normal. GU:  Deferred   ____________________________________________   LABS (all labs ordered are listed, but only abnormal results are displayed)  Labs Reviewed  CBC WITH DIFFERENTIAL/PLATELET  COMPREHENSIVE METABOLIC PANEL  MAGNESIUM  POCT PREGNANCY, URINE  TROPONIN I (HIGH SENSITIVITY)   ____________________________________________   ED ECG REPORT I, Vanessa Bristol, the attending physician, personally viewed and interpreted this ECG.  EKG is normal sinus rate of 79, no ST elevation, no T wave inversion, normal global intervals except for occasional PVC ____________________________________________  PROCEDURES  Procedure(s) performed (including Critical Care):  Procedures   ____________________________________________   INITIAL IMPRESSION / ASSESSMENT AND PLAN / ED COURSE  Christine Rivera was evaluated in Emergency Department on 03/13/2020 for the symptoms described in the history of present illness. She was evaluated in the context of the global COVID-19 pandemic, which necessitated consideration that the patient might be at risk for infection with the SARS-CoV-2 virus that causes COVID-19. Institutional protocols and algorithms that pertain to the evaluation of patients at risk for COVID-19 are in a state of rapid change based on information released by regulatory bodies including the CDC and federal and state organizations. These policies and algorithms were followed during the patient's care in the ED.    Patient is a well-appearing 39 year old who came in with tingling and new elevated blood pressure.  Will get labs to evaluate for electrolyte abnormalities, AKI, ACS although no chest pain so low suspicion.  Will get urine prior to evaluate for pregnancy.  Labs are reassuring.  EKG with some PVCs which I discussed with patient.  Cardiac marker is negative.  No signs of pregnancy.  Discussed with patient that this is an isolated one-time elevated blood pressure but given it is so high I  think it be reasonable to start her on blood pressure medication.  Patient has a blood pressure cuff at home and she feels comfortable checking her blood pressures at home.  If her blood pressures greater than 140 she is going to take the dose of hydrochlorothiazide.  Patient has a primary care doctor she can follow-up with in 1 week and she is going to record her blood pressures up until then.   We discussed return precautions in regards to her elevated blood pressure  Patient continues to deny any symptoms.  I discussed the provisional nature of ED diagnosis, the treatment so far, the ongoing plan of care, follow up appointments and return precautions with the patient and any family or support people present. They expressed understanding and agreed with the plan, discharged home. ____________________________________________   FINAL CLINICAL IMPRESSION(S) / ED DIAGNOSES   Final diagnoses:  Secondary hypertension      MEDICATIONS GIVEN DURING THIS VISIT:  Medications - No data to display   ED Discharge Orders         Ordered    hydrochlorothiazide (HYDRODIURIL) 12.5 MG  tablet  Daily     03/13/20 1902           Note:  This document was prepared using Dragon voice recognition software and may include unintentional dictation errors.   Vanessa Love, MD 03/13/20 Christine Rivera    Vanessa , MD 03/13/20 [phone]

## 2020-03-13 NOTE — Discharge Instructions (Addendum)
Your blood pressure is elevated and you had some PVCs on her EKG.  We are going to start you on a blood pressure medicine.  Take your blood pressure once in the morning and once at nighttime.  If your blood pressure in the morning is over 140/100 I would recommend you take the blood pressure medicine.  Please record if you took it.  He can also record your blood pressure at nighttime to so we can see how it is trending.  Follow-up with your primary doctor in 1 week

## 2020-03-16 ENCOUNTER — Other Ambulatory Visit: Payer: Self-pay

## 2020-03-16 ENCOUNTER — Encounter: Payer: Self-pay | Admitting: Family Medicine

## 2020-03-16 ENCOUNTER — Ambulatory Visit (INDEPENDENT_AMBULATORY_CARE_PROVIDER_SITE_OTHER): Payer: 59 | Admitting: Family Medicine

## 2020-03-16 DIAGNOSIS — I10 Essential (primary) hypertension: Secondary | ICD-10-CM

## 2020-03-16 NOTE — Assessment & Plan Note (Signed)
Continue low dose HCTZ, follow BP at home.   No clear secondary cause on lab eval, No S/S of thyroid issue.   Follow up in 2 months for BP re-eval with risk factor lab eval.

## 2020-03-16 NOTE — Progress Notes (Signed)
Chief Complaint  Patient presents with  . Follow-up    [facility] ER-HTN    History of Present Illness: HPI  39 year old female patient presents for ER follow up.  She was seen on 03/13/2020 for elevated blood pressure. Summary as follows: she felt like her entire face was having a little bit of tingling lasted a few hours and then went away.  She stated that she checked her blood pressure and it was in the A999333 systolic. she denies any headaches, tingling, weakness, aphasia or any other concerns.  Feels at her baseline self. BP was 182/115  Eval in ER: neg U preg  CMET, cbc, troponin I, mg all normal  EKG:  NSR with PVCs.  Hx of pre-eclampsia but has never needed BP meds.   She was started on HCTZ 12.5 mg daily. She has taken this daily.  At home BPs now down at  140/97  No further symptoms, feels well.   BP Readings from Last 3 Encounters:  03/16/20 136/89  03/13/20 (!) 182/115  06/26/18 130/80    This visit occurred during the SARS-CoV-2 public health emergency.  Safety protocols were in place, including screening questions prior to the visit, additional usage of staff PPE, and extensive cleaning of exam room while observing appropriate contact time as indicated for disinfecting solutions.   COVID 19 screen:  No recent travel or known exposure to COVID19 The patient denies respiratory symptoms of COVID 19 at this time. The importance of social distancing was discussed today.     Review of Systems  Constitutional: Negative for chills and fever.  HENT: Negative for congestion and ear pain.   Eyes: Negative for pain and redness.  Respiratory: Negative for cough and shortness of breath.   Cardiovascular: Negative for chest pain, palpitations and leg swelling.  Gastrointestinal: Negative for abdominal pain, blood in stool, constipation, diarrhea, nausea and vomiting.  Genitourinary: Negative for dysuria.  Musculoskeletal: Negative for falls and myalgias.  Skin: Negative for  rash.  Neurological: Negative for dizziness.  Psychiatric/Behavioral: Negative for depression. The patient is not nervous/anxious.       Past Medical History:  Diagnosis Date  . Medical history non-contributory     reports that she has never smoked. She has never used smokeless tobacco. She reports that she does not drink alcohol or use drugs.   Current Outpatient Medications:  .  hydrochlorothiazide (HYDRODIURIL) 12.5 MG tablet, Take 1 tablet (12.5 mg total) by mouth daily., Disp: 30 tablet, Rfl: 0 .  JUNEL FE 1/20 1-20 MG-MCG tablet, Take 1 tablet by mouth daily., Disp: , Rfl:    Observations/Objective: Blood pressure 136/89, pulse 96, temperature 97.9 F (36.6 C), temperature source Temporal, height 5' 7.25" (1.708 m), weight 171 lb 8 oz (77.8 kg), last menstrual period 03/14/2020, SpO2 98 %, unknown if currently breastfeeding.  Physical Exam Constitutional:      General: She is not in acute distress.    Appearance: Normal appearance. She is well-developed. She is not ill-appearing or toxic-appearing.  HENT:     Head: Normocephalic.     Right Ear: Hearing, tympanic membrane, ear canal and external ear normal. Tympanic membrane is not erythematous, retracted or bulging.     Left Ear: Hearing, tympanic membrane, ear canal and external ear normal. Tympanic membrane is not erythematous, retracted or bulging.     Nose: No mucosal edema or rhinorrhea.     Right Sinus: No maxillary sinus tenderness or frontal sinus tenderness.     Left  Sinus: No maxillary sinus tenderness or frontal sinus tenderness.     Mouth/Throat:     Pharynx: Uvula midline.  Eyes:     General: Lids are normal. Lids are everted, no foreign bodies appreciated.     Conjunctiva/sclera: Conjunctivae normal.     Pupils: Pupils are equal, round, and reactive to light.  Neck:     Thyroid: No thyroid mass or thyromegaly.     Vascular: No carotid bruit.     Trachea: Trachea normal.  Cardiovascular:     Rate and  Rhythm: Normal rate and regular rhythm.     Pulses: Normal pulses.     Heart sounds: Normal heart sounds, S1 normal and S2 normal. No murmur. No friction rub. No gallop.   Pulmonary:     Effort: Pulmonary effort is normal. No tachypnea or respiratory distress.     Breath sounds: Normal breath sounds. No decreased breath sounds, wheezing, rhonchi or rales.  Abdominal:     General: Bowel sounds are normal.     Palpations: Abdomen is soft.     Tenderness: There is no abdominal tenderness.  Musculoskeletal:     Cervical back: Normal range of motion and neck supple.  Skin:    General: Skin is warm and dry.     Findings: No rash.  Neurological:     Mental Status: She is alert.  Psychiatric:        Mood and Affect: Mood is not anxious or depressed.        Speech: Speech normal.        Behavior: Behavior normal. Behavior is cooperative.        Thought Content: Thought content normal.        Judgment: Judgment normal.      Assessment and Plan   Benign essential hypertension Continue low dose HCTZ, follow BP at home.   No clear secondary cause on lab eval, No S/S of thyroid issue.   Follow up in 2 months for BP re-eval with risk factor lab eval.     Eliezer Lofts, MD

## 2020-03-16 NOTE — Patient Instructions (Addendum)
Work on  heart healthy eating, regular exercise, weight loss.  Work on stress reduction.  Take HCTZ daily.   In next week.. check blood pressure daily..  Send a message with BP measurement.    Hypertension, Adult High blood pressure (hypertension) is when the force of blood pumping through the arteries is too strong. The arteries are the blood vessels that carry blood from the heart throughout the body. Hypertension forces the heart to work harder to pump blood and may cause arteries to become narrow or stiff. Untreated or uncontrolled hypertension can cause a heart attack, heart failure, a stroke, kidney disease, and other problems. A blood pressure reading consists of a higher number over a lower number. Ideally, your blood pressure should be below 120/80. The first ("top") number is called the systolic pressure. It is a measure of the pressure in your arteries as your heart beats. The second ("bottom") number is called the diastolic pressure. It is a measure of the pressure in your arteries as the heart relaxes. What are the causes? The exact cause of this condition is not known. There are some conditions that result in or are related to high blood pressure. What increases the risk? Some risk factors for high blood pressure are under your control. The following factors may make you more likely to develop this condition:  Smoking.  Having type 2 diabetes mellitus, high cholesterol, or both.  Not getting enough exercise or physical activity.  Being overweight.  Having too much fat, sugar, calories, or salt (sodium) in your diet.  Drinking too much alcohol. Some risk factors for high blood pressure may be difficult or impossible to change. Some of these factors include:  Having chronic kidney disease.  Having a family history of high blood pressure.  Age. Risk increases with age.  Race. You may be at higher risk if you are African American.  Gender. Men are at higher risk than  women before age 34. After age 40, women are at higher risk than men.  Having obstructive sleep apnea.  Stress. What are the signs or symptoms? High blood pressure may not cause symptoms. Very high blood pressure (hypertensive crisis) may cause:  Headache.  Anxiety.  Shortness of breath.  Nosebleed.  Nausea and vomiting.  Vision changes.  Severe chest pain.  Seizures. How is this diagnosed? This condition is diagnosed by measuring your blood pressure while you are seated, with your arm resting on a flat surface, your legs uncrossed, and your feet flat on the floor. The cuff of the blood pressure monitor will be placed directly against the skin of your upper arm at the level of your heart. It should be measured at least twice using the same arm. Certain conditions can cause a difference in blood pressure between your right and left arms. Certain factors can cause blood pressure readings to be lower or higher than normal for a short period of time:  When your blood pressure is higher when you are in a health care provider's office than when you are at home, this is called white coat hypertension. Most people with this condition do not need medicines.  When your blood pressure is higher at home than when you are in a health care provider's office, this is called masked hypertension. Most people with this condition may need medicines to control blood pressure. If you have a high blood pressure reading during one visit or you have normal blood pressure with other risk factors, you may be asked  to:  Return on a different day to have your blood pressure checked again.  Monitor your blood pressure at home for 1 week or longer. If you are diagnosed with hypertension, you may have other blood or imaging tests to help your health care provider understand your overall risk for other conditions. How is this treated? This condition is treated by making healthy lifestyle changes, such as eating  healthy foods, exercising more, and reducing your alcohol intake. Your health care provider may prescribe medicine if lifestyle changes are not enough to get your blood pressure under control, and if:  Your systolic blood pressure is above 130.  Your diastolic blood pressure is above 80. Your personal target blood pressure may vary depending on your medical conditions, your age, and other factors. Follow these instructions at home: Eating and drinking   Eat a diet that is high in fiber and potassium, and low in sodium, added sugar, and fat. An example eating plan is called the DASH (Dietary Approaches to Stop Hypertension) diet. To eat this way: ? Eat plenty of fresh fruits and vegetables. Try to fill one half of your plate at each meal with fruits and vegetables. ? Eat whole grains, such as whole-wheat pasta, brown rice, or whole-grain bread. Fill about one fourth of your plate with whole grains. ? Eat or drink low-fat dairy products, such as skim milk or low-fat yogurt. ? Avoid fatty cuts of meat, processed or cured meats, and poultry with skin. Fill about one fourth of your plate with lean proteins, such as fish, chicken without skin, beans, eggs, or tofu. ? Avoid pre-made and processed foods. These tend to be higher in sodium, added sugar, and fat.  Reduce your daily sodium intake. Most people with hypertension should eat less than 1,500 mg of sodium a day.  Do not drink alcohol if: ? Your health care provider tells you not to drink. ? You are pregnant, may be pregnant, or are planning to become pregnant.  If you drink alcohol: ? Limit how much you use to:  0-1 drink a day for women.  0-2 drinks a day for men. ? Be aware of how much alcohol is in your drink. In the [place] one drink equals one 12 oz bottle of beer (355 mL), one 5 oz glass of wine (148 mL), or one 1 oz glass of hard liquor (44 mL). Lifestyle   Work with your health care provider to maintain a healthy body weight or  to lose weight. Ask what an ideal weight is for you.  Get at least 30 minutes of exercise most days of the week. Activities may include walking, swimming, or biking.  Include exercise to strengthen your muscles (resistance exercise), such as Pilates or lifting weights, as part of your weekly exercise routine. Try to do these types of exercises for 30 minutes at least 3 days a week.  Do not use any products that contain nicotine or tobacco, such as cigarettes, e-cigarettes, and chewing tobacco. If you need help quitting, ask your health care provider.  Monitor your blood pressure at home as told by your health care provider.  Keep all follow-up visits as told by your health care provider. This is important. Medicines  Take over-the-counter and prescription medicines only as told by your health care provider. Follow directions carefully. Blood pressure medicines must be taken as prescribed.  Do not skip doses of blood pressure medicine. Doing this puts you at risk for problems and can make the medicine  less effective.  Ask your health care provider about side effects or reactions to medicines that you should watch for. Contact a health care provider if you:  Think you are having a reaction to a medicine you are taking.  Have headaches that keep coming back (recurring).  Feel dizzy.  Have swelling in your ankles.  Have trouble with your vision. Get help right away if you:  Develop a severe headache or confusion.  Have unusual weakness or numbness.  Feel faint.  Have severe pain in your chest or abdomen.  Vomit repeatedly.  Have trouble breathing. Summary  Hypertension is when the force of blood pumping through your arteries is too strong. If this condition is not controlled, it may put you at risk for serious complications.  Your personal target blood pressure may vary depending on your medical conditions, your age, and other factors. For most people, a normal blood  pressure is less than 120/80.  Hypertension is treated with lifestyle changes, medicines, or a combination of both. Lifestyle changes include losing weight, eating a healthy, low-sodium diet, exercising more, and limiting alcohol. This information is not intended to replace advice given to you by your health care provider. Make sure you discuss any questions you have with your health care provider. Document Revised: 08/12/2018 Document Reviewed: 08/12/2018 Elsevier Patient Education  2020 [employer].

## 2020-04-10 ENCOUNTER — Telehealth: Payer: Self-pay | Admitting: Family Medicine

## 2020-04-10 MED ORDER — HYDROCHLOROTHIAZIDE 12.5 MG PO TABS: 12.5000 mg | ORAL_TABLET | Freq: Every day | ORAL | 1 refills | Status: DC

## 2020-04-10 NOTE — Telephone Encounter (Signed)
Patient called in regards to refill request  She was advised by pharmacy to call our office because they have reached out to [place] and received no response, I did not see refill request  Patient is needing hydrochlorothiazide (HYDRODIURIL) 12.5 MG tablet     [facility] #[id number] [address], [place]

## 2020-04-10 NOTE — Telephone Encounter (Signed)
Refill sent as requested. 

## 2020-04-21 ENCOUNTER — Ambulatory Visit: Payer: 59 | Attending: Internal Medicine

## 2020-04-21 DIAGNOSIS — Z23 Encounter for immunization: Secondary | ICD-10-CM

## 2020-04-21 NOTE — Progress Notes (Signed)
   Covid-19 Vaccination Clinic  Name:  Christine Rivera    MRN: [id number] DOB: 27-Jul-1981  04/21/2020  Christine Rivera was observed post Covid-19 immunization for 15 minutes without incident. She was provided with Vaccine Information Sheet and instruction to access the V-Safe system.   Christine Rivera was instructed to call 911 with any severe reactions post vaccine: Marland Kitchen Difficulty breathing  . Swelling of face and throat  . A fast heartbeat  . A bad rash all over body  . Dizziness and weakness   Immunizations Administered    Name Date Dose VIS Date [place] COVID-19 Vaccine 04/21/2020  8:34 AM 0.3 mL 02/09/2019 Intramuscular   Manufacturer: [place]   Lot: [id number]   [place]: [id number]

## 2020-05-08 ENCOUNTER — Ambulatory Visit: Payer: 59 | Attending: Internal Medicine

## 2020-05-08 DIAGNOSIS — Z20822 Contact with and (suspected) exposure to covid-19: Secondary | ICD-10-CM

## 2020-05-09 LAB — NOVEL CORONAVIRUS, NAA: SARS-CoV-2, NAA: NOT DETECTED

## 2020-05-09 LAB — SARS-COV-2, NAA 2 DAY TAT

## 2020-05-23 ENCOUNTER — Ambulatory Visit: Payer: 59 | Attending: Internal Medicine

## 2020-05-23 DIAGNOSIS — Z23 Encounter for immunization: Secondary | ICD-10-CM

## 2020-05-23 NOTE — Progress Notes (Signed)
   Covid-19 Vaccination Clinic  Name:  Christine Rivera    MRN: [id number] DOB: July 18, 1981  05/23/2020  Christine Rivera was observed post Covid-19 immunization for 15 minutes without incident. She was provided with Vaccine Information Sheet and instruction to access the V-Safe system.   Christine Rivera was instructed to call 911 with any severe reactions post vaccine: Marland Kitchen Difficulty breathing  . Swelling of face and throat  . A fast heartbeat  . A bad rash all over body  . Dizziness and weakness   Immunizations Administered    Name Date Dose VIS Date Route   Pfizer COVID-19 Vaccine 05/23/2020 11:37 AM 0.3 mL 02/09/2019 Intramuscular   Manufacturer: [place]   Lot: [id number]   [place]: [id number]

## 2020-05-25 ENCOUNTER — Telehealth: Payer: Self-pay | Admitting: Family Medicine

## 2020-05-25 DIAGNOSIS — I10 Essential (primary) hypertension: Secondary | ICD-10-CM

## 2020-05-25 NOTE — Telephone Encounter (Signed)
-----   Message from Ellamae Sia sent at 05/10/2020 12:19 PM EDT ----- Regarding: Lab orders for Friday, 6.11.21 Patient is scheduled for CPX labs, please order future labs, Thanks , Karna Christmas

## 2020-05-26 ENCOUNTER — Other Ambulatory Visit (INDEPENDENT_AMBULATORY_CARE_PROVIDER_SITE_OTHER): Payer: 59

## 2020-05-26 DIAGNOSIS — I10 Essential (primary) hypertension: Secondary | ICD-10-CM

## 2020-05-26 LAB — COMPREHENSIVE METABOLIC PANEL
ALT: 8 U/L (ref 0–35)
AST: 13 U/L (ref 0–37)
Albumin: 3.9 g/dL (ref 3.5–5.2)
Alkaline Phosphatase: 61 U/L (ref 39–117)
BUN: 12 mg/dL (ref 6–23)
CO2: 29 mEq/L (ref 19–32)
Calcium: 8.9 mg/dL (ref 8.4–10.5)
Chloride: 103 mEq/L (ref 96–112)
Creatinine, Ser: 0.98 mg/dL (ref 0.40–1.20)
GFR: 62.44 mL/min (ref >60.00)
Glucose, Bld: 95 mg/dL (ref 70–99)
Potassium: 3.5 mEq/L (ref 3.5–5.1)
Sodium: 138 mEq/L (ref 135–145)
Total Bilirubin: 0.3 mg/dL (ref 0.2–1.2)
Total Protein: 6.9 g/dL (ref 6.0–8.3)

## 2020-05-26 LAB — LIPID PANEL
Cholesterol: 156 mg/dL (ref 0–200)
HDL: 33 mg/dL — ABNORMAL LOW (ref >39.00)
NonHDL: 123.28
Total CHOL/HDL Ratio: 5
Triglycerides: 201 mg/dL — ABNORMAL HIGH (ref 0.0–149.0)
VLDL: 40.2 mg/dL — ABNORMAL HIGH (ref 0.0–40.0)

## 2020-05-26 LAB — LDL CHOLESTEROL, DIRECT: Direct LDL: 95 mg/dL

## 2020-05-26 NOTE — Progress Notes (Signed)
No critical labs need to be addressed urgently. We will discuss labs in detail at upcoming office visit.   

## 2020-06-02 ENCOUNTER — Encounter: Payer: Self-pay | Admitting: Family Medicine

## 2020-06-02 ENCOUNTER — Other Ambulatory Visit: Payer: Self-pay

## 2020-06-02 ENCOUNTER — Ambulatory Visit (INDEPENDENT_AMBULATORY_CARE_PROVIDER_SITE_OTHER): Payer: 59 | Admitting: Family Medicine

## 2020-06-02 VITALS — BP 120/80 | HR 75 | Temp 98.1°F | Ht 67.25 in | Wt 171.0 lb

## 2020-06-02 DIAGNOSIS — I10 Essential (primary) hypertension: Secondary | ICD-10-CM | POA: Diagnosis not present

## 2020-06-02 DIAGNOSIS — Z0000 Encounter for general adult medical examination without abnormal findings: Secondary | ICD-10-CM

## 2020-06-02 NOTE — Patient Instructions (Signed)

## 2020-06-02 NOTE — Progress Notes (Signed)
Chief Complaint  Patient presents with  . Annual Exam    History of Present Illness: HPI  The patient is here for annual wellness exam and preventative care.    Hypertension:   Good control on HCTZ, no SE. BP Readings from Last 3 Encounters:  06/02/20 120/80  03/16/20 136/89  03/13/20 (!) 182/115  Using medication without problems or lightheadedness:  none Chest pain with exertion:none Edema:none Short of breath:none Average home BPs: at goal. Other issues:  Diet: moderate  Exercise: active    This visit occurred during the SARS-CoV-2 public health emergency.  Safety protocols were in place, including screening questions prior to the visit, additional usage of staff PPE, and extensive cleaning of exam room while observing appropriate contact time as indicated for disinfecting solutions.   COVID 19 screen:  No recent travel or known exposure to COVID19 The patient denies respiratory symptoms of COVID 19 at this time. The importance of social distancing was discussed today.     Review of Systems  Constitutional: Negative for chills and fever.  HENT: Negative for congestion and ear pain.   Eyes: Negative for pain and redness.  Respiratory: Negative for cough and shortness of breath.   Cardiovascular: Negative for chest pain, palpitations and leg swelling.  Gastrointestinal: Negative for abdominal pain, blood in stool, constipation, diarrhea, nausea and vomiting.  Genitourinary: Negative for dysuria.  Musculoskeletal: Negative for falls and myalgias.  Skin: Negative for rash.  Neurological: Negative for dizziness.  Psychiatric/Behavioral: Negative for depression. The patient is not nervous/anxious.       Past Medical History:  Diagnosis Date  . Medical history non-contributory     reports that she has never smoked. She has never used smokeless tobacco. She reports that she does not drink alcohol and does not use drugs.   Current Outpatient Medications:  .   hydrochlorothiazide (HYDRODIURIL) 12.5 MG tablet, Take 1 tablet (12.5 mg total) by mouth daily., Disp: 90 tablet, Rfl: 1 .  JUNEL FE 1/20 1-20 MG-MCG tablet, Take 1 tablet by mouth daily., Disp: , Rfl:    Observations/Objective: Blood pressure 120/80, pulse 75, temperature 98.1 F (36.7 C), temperature source Temporal, height 5' 7.25" (1.708 m), weight 171 lb (77.6 kg), last menstrual period 05/08/2020, SpO2 99 %, not currently breastfeeding.  Physical Exam Constitutional:      General: She is not in acute distress.    Appearance: Normal appearance. She is well-developed. She is not ill-appearing or toxic-appearing.  HENT:     Head: Normocephalic.     Right Ear: Hearing, tympanic membrane, ear canal and external ear normal.     Left Ear: Hearing, tympanic membrane, ear canal and external ear normal.     Nose: Nose normal.  Eyes:     General: Lids are normal. Lids are everted, no foreign bodies appreciated.     Conjunctiva/sclera: Conjunctivae normal.     Pupils: Pupils are equal, round, and reactive to light.  Neck:     Thyroid: No thyroid mass or thyromegaly.     Vascular: No carotid bruit.     Trachea: Trachea normal.  Cardiovascular:     Rate and Rhythm: Normal rate and regular rhythm.     Heart sounds: Normal heart sounds, S1 normal and S2 normal. No murmur heard.  No gallop.   Pulmonary:     Effort: Pulmonary effort is normal. No respiratory distress.     Breath sounds: Normal breath sounds. No wheezing, rhonchi or rales.  Abdominal:  General: Bowel sounds are normal. There is no distension or abdominal bruit.     Palpations: Abdomen is soft. There is no fluid wave or mass.     Tenderness: There is no abdominal tenderness. There is no guarding or rebound.     Hernia: No hernia is present.  Musculoskeletal:     Cervical back: Normal range of motion and neck supple.  Lymphadenopathy:     Cervical: No cervical adenopathy.  Skin:    General: Skin is warm and dry.      Findings: No rash.  Neurological:     Mental Status: She is alert.     Cranial Nerves: No cranial nerve deficit.     Sensory: No sensory deficit.  Psychiatric:        Mood and Affect: Mood is not anxious or depressed.        Speech: Speech normal.        Behavior: Behavior normal. Behavior is cooperative.        Judgment: Judgment normal.      Assessment and Plan The patient's preventative maintenance and recommended screening tests for an annual wellness exam were reviewed in full today. Brought up to date unless services declined.  Counselled on the importance of diet, exercise, and its role in overall health and mortality. The patient's FH and SH was reviewed, including their home life, tobacco status, and drug and alcohol status.   GYN (Dr. Julien Girt) pap and pelvic exam.   Mammogram 2020 Up to date with Tdap 2012. S/P COVID19 vaccine. Denies STD screen  Nonsmoker  ETOH occ / no drug use Father colon cancer age 63s.. Nongenetic.  PGM breast cancer.   Benign essential hypertension Well controlled. Continue current medication. Encouraged exercise, weight loss, healthy eating habits.      Eliezer Lofts, MD

## 2020-06-02 NOTE — Assessment & Plan Note (Signed)
Well controlled. Continue current medication. Encouraged exercise, weight loss, healthy eating habits.  

## 2020-06-14 ENCOUNTER — Encounter: Payer: Self-pay | Admitting: Family Medicine

## 2020-07-30 ENCOUNTER — Other Ambulatory Visit: Payer: Self-pay | Admitting: Family Medicine

## 2020-10-11 ENCOUNTER — Ambulatory Visit (INDEPENDENT_AMBULATORY_CARE_PROVIDER_SITE_OTHER): Payer: 59 | Admitting: Dermatology

## 2020-10-11 ENCOUNTER — Other Ambulatory Visit: Payer: Self-pay

## 2020-10-11 DIAGNOSIS — Z1283 Encounter for screening for malignant neoplasm of skin: Secondary | ICD-10-CM

## 2020-10-11 DIAGNOSIS — L814 Other melanin hyperpigmentation: Secondary | ICD-10-CM

## 2020-10-11 DIAGNOSIS — D239 Other benign neoplasm of skin, unspecified: Secondary | ICD-10-CM

## 2020-10-11 DIAGNOSIS — D485 Neoplasm of uncertain behavior of skin: Secondary | ICD-10-CM | POA: Diagnosis not present

## 2020-10-11 DIAGNOSIS — L578 Other skin changes due to chronic exposure to nonionizing radiation: Secondary | ICD-10-CM

## 2020-10-11 DIAGNOSIS — D2261 Melanocytic nevi of right upper limb, including shoulder: Secondary | ICD-10-CM

## 2020-10-11 DIAGNOSIS — L811 Chloasma: Secondary | ICD-10-CM | POA: Diagnosis not present

## 2020-10-11 HISTORY — DX: Other benign neoplasm of skin, unspecified: D23.9

## 2020-10-11 NOTE — Patient Instructions (Signed)
Wound Care Instructions  1. Cleanse wound gently with soap and water once a day then pat dry with clean gauze. Apply a thing coat of Petrolatum (petroleum jelly, "Vaseline") over the wound (unless you have an allergy to this). We recommend that you use a new, sterile tube of Vaseline. Do not pick or remove scabs. Do not remove the yellow or white "healing tissue" from the base of the wound.  2. Cover the wound with fresh, clean, nonstick gauze and secure with paper tape. You may use Band-Aids in place of gauze and tape if the would is small enough, but would recommend trimming much of the tape off as there is often too much. Sometimes Band-Aids can irritate the skin.  3. You should call the office for your biopsy report after 1 week if you have not already been contacted.  4. If you experience any problems, such as abnormal amounts of bleeding, swelling, significant bruising, significant pain, or evidence of infection, please call the office immediately.  5. FOR ADULT SURGERY PATIENTS: If you need something for pain relief you may take 1 extra strength Tylenol (acetaminophen) AND 2 Ibuprofen (200mg each) together every 4 hours as needed for pain. (do not take these if you are allergic to them or if you have a reason you should not take them.) Typically, you may only need pain medication for 1 to 3 days.   Instructions for [facility] Medications  One or more of your medications was sent to the [facility] mail order compounding pharmacy. You will receive an email from them and can purchase the medicine through that link. It will then be mailed to your home at the address you confirmed. If for any reason you do not receive an email from them, please check your spam folder. If you still do not find the email, please let us know. [facility] phone number is [phone].   

## 2020-10-11 NOTE — Progress Notes (Signed)
New Patient Visit  Subjective  Christine Rivera is a 39 y.o. female who presents for the following: brown spots (on the face - growing larger, and turning darker especially during the summer months) and nevi (on the post leg, and back - irregular appearing ).  The following portions of the chart were reviewed this encounter and updated as appropriate:  Tobacco  Allergies  Meds  Problems  Med Hx  Surg Hx  Fam Hx     Review of Systems:  No other skin or systemic complaints except as noted in HPI or Assessment and Plan.  Objective  Well appearing patient in no apparent distress; mood and affect are within normal limits.  A focused examination was performed including the face, trunk, extremities. Relevant physical exam findings are noted in the Assessment and Plan.  Objective  Face: Reticulated hyperpigmented patches.   Images            Objective  R calf: 0.6 cm irregular brown macule   Objective  Left Upper Back 3.0 cm lat to spine: 0.6 cm irregular brown papule   Objective  R inf side above the hip: 0.6 cm irregular brown macule   Assessment & Plan  Melasma -severe Face Causes include hormones, heredity, and sun exposure -   Advised patient that condition is not curable, but can be improved. Avoid sun exposure and wear sunscreen daily.Recommend taking Heliocare sun protection supplement daily in sunny weather for additional sun protection. For maximum protection on the sunniest days, you can take up to 2 capsules of regular Heliocare OR take 1 capsule of Heliocare Ultra. For prolonged exposure (such as a full day in the sun), you can repeat your dose of the supplement 4 hours after your first dose. Heliocare can be purchased at [facility] or at [email or web address]. Start [facility] Hydroquinone mix QD x 3 months.   Start [facility] melasma mix Hydroquinone: 12%, Kojic Acid:6%, Niacinamide: 2%, Vitamin C: 1%, Vehicle: Cream  Plan on switching  to a Tretinoin containing mixture at her follow up appointment if needed.    Neoplasm of uncertain behavior of skin (3) R calf Epidermal / dermal shaving  Lesion diameter (cm):  0.6 Informed consent: discussed and consent obtained   Timeout: patient name, date of birth, surgical site, and procedure verified   Procedure prep:  Patient was prepped and draped in usual sterile fashion Prep type:  Isopropyl alcohol Anesthesia: the lesion was anesthetized in a standard fashion   Anesthetic:  1% lidocaine w/ epinephrine 1-100,000 buffered w/ 8.4% NaHCO3 Instrument used: flexible razor blade   Hemostasis achieved with: pressure, aluminum chloride and electrodesiccation   Outcome: patient tolerated procedure well   Post-procedure details: sterile dressing applied and wound care instructions given   Dressing type: bandage and petrolatum    Specimen 1 - Surgical pathology Differential Diagnosis: D48.5 r/o dysplastic nevus  Check Margins: No 0.6 cm dark brown macule  Left Upper Back 3.0 cm lat to spine Epidermal / dermal shaving  Lesion diameter (cm):  0.6 Informed consent: discussed and consent obtained   Timeout: patient name, date of birth, surgical site, and procedure verified   Procedure prep:  Patient was prepped and draped in usual sterile fashion Prep type:  Isopropyl alcohol Anesthesia: the lesion was anesthetized in a standard fashion   Anesthetic:  1% lidocaine w/ epinephrine 1-100,000 buffered w/ 8.4% NaHCO3 Instrument used: flexible razor blade   Hemostasis achieved with: pressure, aluminum chloride and electrodesiccation   Outcome:  patient tolerated procedure well   Post-procedure details: sterile dressing applied and wound care instructions given   Dressing type: bandage and petrolatum    Specimen 2 - Surgical pathology Differential Diagnosis: D48.5 r/o dysplastic nevus  Check Margins: No 0.6 cm irregular brown papule  R inf side above the hip Epidermal / dermal  shaving  Lesion diameter (cm):  0.6 Informed consent: discussed and consent obtained   Timeout: patient name, date of birth, surgical site, and procedure verified   Procedure prep:  Patient was prepped and draped in usual sterile fashion Prep type:  Isopropyl alcohol Anesthesia: the lesion was anesthetized in a standard fashion   Anesthetic:  1% lidocaine w/ epinephrine 1-100,000 buffered w/ 8.4% NaHCO3 Instrument used: flexible razor blade   Hemostasis achieved with: pressure, aluminum chloride and electrodesiccation   Outcome: patient tolerated procedure well   Post-procedure details: sterile dressing applied and wound care instructions given   Dressing type: bandage and petrolatum    Specimen 3 - Surgical pathology Differential Diagnosis: D48.5 r/o dysplastic nevus  Check Margins: No 0.6 cm irregular brown macule  Actinic Damage - diffuse scaly erythematous macules with underlying dyspigmentation - Recommend daily broad spectrum sunscreen SPF 30+ to sun-exposed areas, reapply every 2 hours as needed.  - Call for new or changing lesions.  Melanocytic Nevi - darker colored nevus on the R arm (consider biopsy if today's neo's come back positive for dysplasia) - Tan-brown and/or pink-flesh-colored symmetric macules and papules - Benign appearing on exam today - Observation - Call clinic for new or changing moles - Recommend daily use of broad spectrum spf 30+ sunscreen to sun-exposed areas.   Lentigines - Scattered tan macules - Discussed due to sun exposure - Benign, observe - Call for any changes  Return in about 3 months (around 01/11/2021) for melasma follow up .  Luther Redo, CMA, am acting as scribe for Sarina Ser, MD .  Documentation: I have reviewed the above documentation for accuracy and completeness, and I agree with the above.  Sarina Ser, MD

## 2020-10-12 ENCOUNTER — Encounter: Payer: Self-pay | Admitting: Dermatology

## 2020-10-19 ENCOUNTER — Telehealth: Payer: Self-pay

## 2020-10-19 NOTE — Telephone Encounter (Signed)
-----   Message from Ralene Bathe, MD sent at 10/17/2020  5:28 PM EDT ----- Diagnosis 1. Skin , right calf HEMANGIOMA 2. 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 Skin , right inf side above the hip DYSPLASTIC COMPOUND NEVUS WITH MILD ATYPIA, LIMITED MARGINS FREE  1- benign hemangioma = collection of blood vessels 2- dysplastic Moderate Recheck next visit 3- dysplastic Mild Recheck next visit

## 2020-10-19 NOTE — Telephone Encounter (Signed)
Left message for patient to call for results/hd

## 2020-10-23 ENCOUNTER — Telehealth: Payer: Self-pay

## 2020-10-23 NOTE — Telephone Encounter (Signed)
Left message on voicemail to return my call.  

## 2020-10-23 NOTE — Telephone Encounter (Signed)
-----   Message from Ralene Bathe, MD sent at 10/17/2020  5:28 PM EDT ----- Diagnosis 1. Skin , right calf HEMANGIOMA 2. 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 Skin , right inf side above the hip DYSPLASTIC COMPOUND NEVUS WITH MILD ATYPIA, LIMITED MARGINS FREE  1- benign hemangioma = collection of blood vessels 2- dysplastic Moderate Recheck next visit 3- dysplastic Mild Recheck next visit

## 2020-10-26 ENCOUNTER — Telehealth: Payer: Self-pay

## 2020-10-26 NOTE — Telephone Encounter (Signed)
-----   Message from Ralene Bathe, MD sent at 10/17/2020  5:28 PM EDT ----- Diagnosis 1. Skin , right calf HEMANGIOMA 2. 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 Skin , right inf side above the hip DYSPLASTIC COMPOUND NEVUS WITH MILD ATYPIA, LIMITED MARGINS FREE  1- benign hemangioma = collection of blood vessels 2- dysplastic Moderate Recheck next visit 3- dysplastic Mild Recheck next visit

## 2020-10-26 NOTE — Telephone Encounter (Signed)
Left message on voicemail to return my call.  

## 2020-10-26 NOTE — Telephone Encounter (Signed)
Patient called the office today and made aware of biopsy results.

## 2020-11-17 LAB — HM MAMMOGRAPHY

## 2021-01-11 ENCOUNTER — Ambulatory Visit: Payer: 59 | Admitting: Dermatology

## 2021-02-10 ENCOUNTER — Other Ambulatory Visit: Payer: Self-pay | Admitting: Family Medicine

## 2021-08-03 ENCOUNTER — Telehealth: Payer: Self-pay | Admitting: Family Medicine

## 2021-08-03 MED ORDER — HYDROCHLOROTHIAZIDE 12.5 MG PO TABS: 12.5000 mg | ORAL_TABLET | Freq: Every day | ORAL | 1 refills | Status: DC

## 2021-08-03 NOTE — Telephone Encounter (Signed)
  Encourage patient to contact the pharmacy for refills or they can request refills through [facility]:  Please schedule appointment if longer than 1 year  NEXT APPOINTMENT DATE:  MEDICATION: hydrochlorothiazide (HYDRODIURIL) 12.5 MG tablet  Is the patient out of medication? yes   PHARMACY: [facility]- [address]  Let patient know to contact pharmacy at the end of the day to make sure medication is ready.  Please notify patient to allow 48-72 hours to process  CLINICAL FILLS OUT ALL BELOW:   LAST REFILL:  QTY:  REFILL DATE:    OTHER COMMENTS:    Okay for refill?  Please advise

## 2021-08-03 NOTE — Telephone Encounter (Signed)
Refill sent as requested. 

## 2021-08-06 ENCOUNTER — Other Ambulatory Visit: Payer: Self-pay | Admitting: Family Medicine

## 2021-09-11 ENCOUNTER — Other Ambulatory Visit: Payer: Self-pay

## 2021-09-11 ENCOUNTER — Ambulatory Visit (INDEPENDENT_AMBULATORY_CARE_PROVIDER_SITE_OTHER): Payer: 59 | Admitting: Family Medicine

## 2021-09-11 ENCOUNTER — Encounter: Payer: Self-pay | Admitting: Family Medicine

## 2021-09-11 VITALS — BP 110/80 | HR 72 | Temp 97.9°F | Ht 67.0 in | Wt 175.0 lb

## 2021-09-11 DIAGNOSIS — Z0000 Encounter for general adult medical examination without abnormal findings: Secondary | ICD-10-CM

## 2021-09-11 DIAGNOSIS — I10 Essential (primary) hypertension: Secondary | ICD-10-CM | POA: Diagnosis not present

## 2021-09-11 DIAGNOSIS — Z1159 Encounter for screening for other viral diseases: Secondary | ICD-10-CM

## 2021-09-11 NOTE — Progress Notes (Signed)
Patient ID: Christine Rivera, female    DOB: [birth year], 40 y.o.   MRN: [id number]  This visit was conducted in person.  BP 110/80   Pulse 72   Temp 97.9 F (36.6 C) (Temporal)   Ht 5\' 7"  (1.702 m)   Wt 175 lb (79.4 kg)   LMP 08/26/2021   SpO2 100%   BMI 27.41 kg/m    CC:  Chief Complaint  Patient presents with   Annual Exam    Subjective:   HPI: Christine Rivera is a 40 y.o. female presenting on 09/11/2021 for Annual Exam  Hypertension:   Well controlled on HCTZ 25 mg daily  BP Readings from Last 3 Encounters:  09/11/21 110/80  06/02/20 120/80  03/16/20 136/89  Using medication without problems or lightheadedness:  none Chest pain with exertion:none Edema: occ Short of breath:none Average home BPs: not checking Other issues:  Due for yearly fasting labs.    Diet: moderate  Exercise: gym, walking frequently.  Relevant past medical, surgical, family and social history reviewed and updated as indicated. Interim medical history since our last visit reviewed. Allergies and medications reviewed and updated. Outpatient Medications Prior to Visit  Medication Sig Dispense Refill   hydrochlorothiazide (HYDRODIURIL) 12.5 MG tablet Take 1 tablet (12.5 mg total) by mouth daily. 30 tablet 1   JUNEL FE 1/20 1-20 MG-MCG tablet Take 1 tablet by mouth daily.     No facility-administered medications prior to visit.     Per HPI unless specifically indicated in ROS section below Review of Systems  Constitutional:  Negative for fatigue and fever.  HENT:  Negative for congestion.   Eyes:  Negative for pain.  Respiratory:  Negative for cough and shortness of breath.   Cardiovascular:  Negative for chest pain, palpitations and leg swelling.  Gastrointestinal:  Negative for abdominal pain.  Genitourinary:  Negative for dysuria and vaginal bleeding.  Musculoskeletal:  Negative for back pain.  Neurological:  Negative for syncope, light-headedness and headaches.   Psychiatric/Behavioral:  Negative for dysphoric mood.   Objective:  BP 110/80   Pulse 72   Temp 97.9 F (36.6 C) (Temporal)   Ht 5\' 7"  (1.702 m)   Wt 175 lb (79.4 kg)   LMP 08/26/2021   SpO2 100%   BMI 27.41 kg/m   Wt Readings from Last 3 Encounters:  09/11/21 175 lb (79.4 kg)  06/02/20 171 lb (77.6 kg)  03/16/20 171 lb 8 oz (77.8 kg)      Physical Exam Constitutional:      General: She is not in acute distress.    Appearance: Normal appearance. She is well-developed. She is not ill-appearing or toxic-appearing.  HENT:     Head: Normocephalic.     Right Ear: Hearing, tympanic membrane, ear canal and external ear normal. Tympanic membrane is not erythematous, retracted or bulging.     Left Ear: Hearing, tympanic membrane, ear canal and external ear normal. Tympanic membrane is not erythematous, retracted or bulging.     Nose: No mucosal edema or rhinorrhea.     Right Sinus: No maxillary sinus tenderness or frontal sinus tenderness.     Left Sinus: No maxillary sinus tenderness or frontal sinus tenderness.     Mouth/Throat:     Pharynx: Uvula midline.  Eyes:     General: Lids are normal. Lids are everted, no foreign bodies appreciated.     Conjunctiva/sclera: Conjunctivae normal.     Pupils: Pupils are equal, round, and reactive to light.  Neck:     Thyroid: No thyroid mass or thyromegaly.     Vascular: No carotid bruit.     Trachea: Trachea normal.  Cardiovascular:     Rate and Rhythm: Normal rate and regular rhythm.     Pulses: Normal pulses.     Heart sounds: Normal heart sounds, S1 normal and S2 normal. No murmur heard.   No friction rub. No gallop.  Pulmonary:     Effort: Pulmonary effort is normal. No tachypnea or respiratory distress.     Breath sounds: Normal breath sounds. No decreased breath sounds, wheezing, rhonchi or rales.  Abdominal:     General: Bowel sounds are normal.     Palpations: Abdomen is soft.     Tenderness: There is no abdominal tenderness.   Musculoskeletal:     Cervical back: Normal range of motion and neck supple.  Skin:    General: Skin is warm and dry.     Findings: No rash.  Neurological:     Mental Status: She is alert.  Psychiatric:        Mood and Affect: Mood is not anxious or depressed.        Speech: Speech normal.        Behavior: Behavior normal. Behavior is cooperative.        Thought Content: Thought content normal.        Judgment: Judgment normal.      Results for orders placed or performed in visit on 06/14/20  HM MAMMOGRAPHY  Result Value Ref Range   HM Mammogram 0-4 Bi-Rad 0-4 Bi-Rad, Self Reported Normal    This visit occurred during the SARS-CoV-2 public health emergency.  Safety protocols were in place, including screening questions prior to the visit, additional usage of staff PPE, and extensive cleaning of exam room while observing appropriate contact time as indicated for disinfecting solutions.   COVID 19 screen:  No recent travel or known exposure to COVID19 The patient denies respiratory symptoms of COVID 19 at this time. The importance of social distancing was discussed today.   Assessment and Plan The patient's preventative maintenance and recommended screening tests for an annual wellness exam were reviewed in full today. Brought up to date unless services declined.  Counselled on the importance of diet, exercise, and its role in overall health and mortality. The patient's FH and SH was reviewed, including their home life, tobacco status, and drug and alcohol status.   GYN (Dr. Julien Girt) pap and pelvic exam.. scheduled.  Mammogram 2021 per report... planned at GYN  Up to date with Tdap 2012 ( not interested). S/P COVID19 vaccine x 2 Denies STD screen  Nonsmoker  ETOH occ / no drug use Father colon cancer age 14s.. Nongenetic.  PGM breast cancer.  Hep c due  Problem List Items Addressed This Visit     Benign essential hypertension    Stable, chronic.  Continue current  medication.   Well controlled on HCTZ 25 mg daily      Relevant Orders   Comprehensive metabolic panel   Lipid panel   Routine general medical examination at a health care facility - Primary   Other Visit Diagnoses     Need for hepatitis C screening test       Relevant Orders   Hepatitis C antibody       Eliezer Lofts, MD

## 2021-09-11 NOTE — Assessment & Plan Note (Signed)
Stable, chronic.  Continue current medication.   Well controlled on HCTZ 25 mg daily 

## 2021-09-11 NOTE — Patient Instructions (Addendum)
Make appt for fasting labs when able.  Look into Tetanus  ( TDAp) vaccine.. when last receive.   Keep working on healthy eating and regular exercise.

## 2021-09-13 ENCOUNTER — Encounter: Payer: Self-pay | Admitting: Family Medicine

## 2021-09-18 ENCOUNTER — Other Ambulatory Visit: Payer: 59

## 2021-09-25 ENCOUNTER — Other Ambulatory Visit: Payer: 59

## 2021-10-09 ENCOUNTER — Other Ambulatory Visit: Payer: Self-pay | Admitting: Family Medicine

## 2022-01-04 LAB — HM MAMMOGRAPHY

## 2022-01-04 LAB — RESULTS CONSOLE HPV: CHL HPV: NEGATIVE

## 2022-01-04 LAB — HM PAP SMEAR: HM Pap smear: NEGATIVE

## 2022-01-10 LAB — HM PAP SMEAR: HPV, high-risk: NEGATIVE

## 2022-01-22 ENCOUNTER — Other Ambulatory Visit: Payer: Self-pay | Admitting: Family Medicine

## 2022-10-16 ENCOUNTER — Telehealth: Payer: Self-pay | Admitting: Family Medicine

## 2022-10-16 NOTE — Telephone Encounter (Signed)
LVM to schedule CPE and fasting labs.

## 2022-10-16 NOTE — Telephone Encounter (Signed)
Please call and schedule CPE with fasting labs prior with Dr. Bedsole. 

## 2022-11-23 ENCOUNTER — Other Ambulatory Visit: Payer: Self-pay | Admitting: Family Medicine

## 2022-12-18 ENCOUNTER — Other Ambulatory Visit: Payer: Self-pay | Admitting: Family Medicine

## 2022-12-19 ENCOUNTER — Other Ambulatory Visit: Payer: Self-pay | Admitting: Family Medicine

## 2022-12-19 MED ORDER — HYDROCHLOROTHIAZIDE 12.5 MG PO TABS: 12.5000 mg | ORAL_TABLET | Freq: Every day | ORAL | 11 refills | Status: DC

## 2022-12-19 NOTE — Telephone Encounter (Signed)
Pt schedule a CPE on 02/12/2023

## 2022-12-19 NOTE — Telephone Encounter (Signed)
Rx request sent to provider

## 2023-02-04 ENCOUNTER — Telehealth: Payer: Self-pay | Admitting: Family Medicine

## 2023-02-04 DIAGNOSIS — I10 Essential (primary) hypertension: Secondary | ICD-10-CM

## 2023-02-04 DIAGNOSIS — Z1159 Encounter for screening for other viral diseases: Secondary | ICD-10-CM

## 2023-02-04 NOTE — Telephone Encounter (Signed)
-----   Message from Velna Hatchet, RT sent at 01/20/2023 11:45 AM EST ----- Regarding: Wed 2/21 lab Patient is scheduled for cpx, please order future labs.  Thanks, Anda Kraft

## 2023-02-05 ENCOUNTER — Other Ambulatory Visit: Payer: 59

## 2023-02-06 ENCOUNTER — Other Ambulatory Visit (INDEPENDENT_AMBULATORY_CARE_PROVIDER_SITE_OTHER): Payer: 59

## 2023-02-06 DIAGNOSIS — Z1159 Encounter for screening for other viral diseases: Secondary | ICD-10-CM

## 2023-02-06 DIAGNOSIS — I10 Essential (primary) hypertension: Secondary | ICD-10-CM | POA: Diagnosis not present

## 2023-02-06 LAB — COMPREHENSIVE METABOLIC PANEL
ALT: 11 U/L (ref 0–35)
AST: 16 U/L (ref 0–37)
Albumin: 4.2 g/dL (ref 3.5–5.2)
Alkaline Phosphatase: 69 U/L (ref 39–117)
BUN: 12 mg/dL (ref 6–23)
CO2: 30 mEq/L (ref 19–32)
Calcium: 9.4 mg/dL (ref 8.4–10.5)
Chloride: 100 mEq/L (ref 96–112)
Creatinine, Ser: 0.98 mg/dL (ref 0.40–1.20)
GFR: 70.44 mL/min (ref >60.00)
Glucose, Bld: 90 mg/dL (ref 70–99)
Potassium: 3.7 mEq/L (ref 3.5–5.1)
Sodium: 138 mEq/L (ref 135–145)
Total Bilirubin: 0.6 mg/dL (ref 0.2–1.2)
Total Protein: 7.7 g/dL (ref 6.0–8.3)

## 2023-02-06 LAB — LIPID PANEL
Cholesterol: 154 mg/dL (ref 0–200)
HDL: 35.7 mg/dL — ABNORMAL LOW (ref >39.00)
LDL Cholesterol: 86 mg/dL (ref 0–99)
NonHDL: 117.99
Total CHOL/HDL Ratio: 4
Triglycerides: 162 mg/dL — ABNORMAL HIGH (ref 0.0–149.0)
VLDL: 32.4 mg/dL (ref 0.0–40.0)

## 2023-02-06 NOTE — Progress Notes (Signed)
No critical labs need to be addressed urgently. We will discuss labs in detail at upcoming office visit.   

## 2023-02-07 LAB — HEPATITIS C ANTIBODY: Hepatitis C Ab: NONREACTIVE

## 2023-02-12 ENCOUNTER — Ambulatory Visit (INDEPENDENT_AMBULATORY_CARE_PROVIDER_SITE_OTHER): Payer: 59 | Admitting: Family Medicine

## 2023-02-12 ENCOUNTER — Encounter: Payer: Self-pay | Admitting: Family Medicine

## 2023-02-12 VITALS — BP 110/80 | HR 82 | Temp 97.2°F | Ht 67.1 in | Wt 183.0 lb

## 2023-02-12 DIAGNOSIS — I10 Essential (primary) hypertension: Secondary | ICD-10-CM

## 2023-02-12 DIAGNOSIS — Z0000 Encounter for general adult medical examination without abnormal findings: Secondary | ICD-10-CM | POA: Diagnosis not present

## 2023-02-12 NOTE — Progress Notes (Signed)
Patient ID: Christine Rivera, female    DOB: 03-28-1981, 42 y.o.   MRN: [id number]  This visit was conducted in person.  BP 110/80 (BP Location: Left Arm, Patient Position: Sitting, Cuff Size: Normal)   Pulse 82   Temp (!) 97.2 F (36.2 C) (Temporal)   Ht 5' 7.1" (1.704 m)   Wt 183 lb (83 kg)   SpO2 99%   BMI 28.58 kg/m    CC:  Chief Complaint  Patient presents with   Annual Exam    Subjective:   HPI: Christine Rivera is a 42 y.o. female presenting on 02/12/2023 for Annual Exam  Hypertension:  Well controlled on HCTZ 25 mg daily   She has noted some episodes of BP fluctuations associated with headaches,  no flushing  BPs 150/90. BP Readings from Last 3 Encounters:  02/12/23 110/80  09/11/21 110/80  06/02/20 120/80  Using medication without problems or lightheadedness:  none Chest pain with exertion:none Edema: occ Short of breath:none Average home BPs:  Other issues:  Reviewed labs in detail. Lab Results  Component Value Date   CHOL 154 02/06/2023   HDL 35.70 (L) 02/06/2023   LDLCALC 86 02/06/2023   LDLDIRECT 95.0 05/26/2020   TRIG 162.0 (H) 02/06/2023   CHOLHDL 4 02/06/2023   The 10-year ASCVD risk score (Arnett DK, et al., 2019) is: 1%   Values used to calculate the score:     Age: 65 years     Sex: Female     Is Non-Hispanic African American: No     Diabetic: No     Tobacco smoker: No     Systolic Blood Pressure: [id number] mmHg     Is BP treated: Yes     HDL Cholesterol: 35.7 mg/dL     Total Cholesterol: 154 mg/dL     Diet: moderate  Exercise:  active.Marland Kitchen less since going back to work.  Relevant past medical, surgical, family and social history reviewed and updated as indicated. Interim medical history since our last visit reviewed. Allergies and medications reviewed and updated. Outpatient Medications Prior to Visit  Medication Sig Dispense Refill   hydrochlorothiazide (HYDRODIURIL) 12.5 MG tablet Take 1 tablet (12.5 mg total) by mouth daily. TAKE 1  TABLET(12.5 MG) BY MOUTH DAILY Strength: 12.5 mg 30 tablet 11   JUNEL FE 1/20 1-20 MG-MCG tablet Take 1 tablet by mouth daily.     No facility-administered medications prior to visit.     Per HPI unless specifically indicated in ROS section below Review of Systems  Constitutional:  Negative for fatigue and fever.  HENT:  Negative for congestion.   Eyes:  Negative for pain.  Respiratory:  Negative for cough and shortness of breath.   Cardiovascular:  Negative for chest pain, palpitations and leg swelling.  Gastrointestinal:  Negative for abdominal pain.  Genitourinary:  Negative for dysuria and vaginal bleeding.  Musculoskeletal:  Negative for back pain.  Neurological:  Negative for syncope, light-headedness and headaches.  Psychiatric/Behavioral:  Negative for dysphoric mood.    Objective:  BP 110/80 (BP Location: Left Arm, Patient Position: Sitting, Cuff Size: Normal)   Pulse 82   Temp (!) 97.2 F (36.2 C) (Temporal)   Ht 5' 7.1" (1.704 m)   Wt 183 lb (83 kg)   SpO2 99%   BMI 28.58 kg/m   Wt Readings from Last 3 Encounters:  02/12/23 183 lb (83 kg)  09/11/21 175 lb (79.4 kg)  06/02/20 171 lb (77.6 kg)  Physical Exam Constitutional:      General: She is not in acute distress.    Appearance: Normal appearance. She is well-developed. She is not ill-appearing or toxic-appearing.  HENT:     Head: Normocephalic.     Right Ear: Hearing, tympanic membrane, ear canal and external ear normal. Tympanic membrane is not erythematous, retracted or bulging.     Left Ear: Hearing, tympanic membrane, ear canal and external ear normal. Tympanic membrane is not erythematous, retracted or bulging.     Nose: No mucosal edema or rhinorrhea.     Right Sinus: No maxillary sinus tenderness or frontal sinus tenderness.     Left Sinus: No maxillary sinus tenderness or frontal sinus tenderness.     Mouth/Throat:     Pharynx: Uvula midline.  Eyes:     General: Lids are normal. Lids are  everted, no foreign bodies appreciated.     Conjunctiva/sclera: Conjunctivae normal.     Pupils: Pupils are equal, round, and reactive to light.  Neck:     Thyroid: No thyroid mass or thyromegaly.     Vascular: No carotid bruit.     Trachea: Trachea normal.  Cardiovascular:     Rate and Rhythm: Normal rate and regular rhythm.     Pulses: Normal pulses.     Heart sounds: Normal heart sounds, S1 normal and S2 normal. No murmur heard.    No friction rub. No gallop.  Pulmonary:     Effort: Pulmonary effort is normal. No tachypnea or respiratory distress.     Breath sounds: Normal breath sounds. No decreased breath sounds, wheezing, rhonchi or rales.  Abdominal:     General: Bowel sounds are normal.     Palpations: Abdomen is soft.     Tenderness: There is no abdominal tenderness.  Musculoskeletal:     Cervical back: Normal range of motion and neck supple.  Skin:    General: Skin is warm and dry.     Findings: No rash.  Neurological:     Mental Status: She is alert.  Psychiatric:        Mood and Affect: Mood is not anxious or depressed.        Speech: Speech normal.        Behavior: Behavior normal. Behavior is cooperative.        Thought Content: Thought content normal.        Judgment: Judgment normal.       Results for orders placed or performed in visit on 02/06/23  Comprehensive metabolic panel  Result Value Ref Range   Sodium 138 135 - 145 mEq/L   Potassium 3.7 3.5 - 5.1 mEq/L   Chloride 100 96 - 112 mEq/L   CO2 30 19 - 32 mEq/L   Glucose, Bld 90 70 - 99 mg/dL   BUN 12 6 - 23 mg/dL   Creatinine, Ser 0.98 0.40 - 1.20 mg/dL   Total Bilirubin 0.6 0.2 - 1.2 mg/dL   Alkaline Phosphatase 69 39 - 117 U/L   AST 16 0 - 37 U/L   ALT 11 0 - 35 U/L   Total Protein 7.7 6.0 - 8.3 g/dL   Albumin 4.2 3.5 - 5.2 g/dL   GFR 70.44 >60.00 mL/min   Calcium 9.4 8.4 - 10.5 mg/dL  Lipid panel  Result Value Ref Range   Cholesterol 154 0 - 200 mg/dL   Triglycerides 162.0 (H) 0.0 - 149.0  mg/dL   HDL 35.70 (L) >39.00 mg/dL   VLDL 32.4 0.0 -  40.0 mg/dL   LDL Cholesterol 86 0 - 99 mg/dL   Total CHOL/HDL Ratio 4    NonHDL 117.99   Hepatitis C antibody  Result Value Ref Range   Hepatitis C Ab NON-REACTIVE NON-REACTIVE    This visit occurred during the SARS-CoV-2 public health emergency.  Safety protocols were in place, including screening questions prior to the visit, additional usage of staff PPE, and extensive cleaning of exam room while observing appropriate contact time as indicated for disinfecting solutions.   COVID 19 screen:  No recent travel or known exposure to COVID19 The patient denies respiratory symptoms of COVID 19 at this time. The importance of social distancing was discussed today.   Assessment and Plan The patient's preventative maintenance and recommended screening tests for an annual wellness exam were reviewed in full today. Brought up to date unless services declined.  Counselled on the importance of diet, exercise, and its role in overall health and mortality. The patient's FH and SH was reviewed, including their home life, tobacco status, and drug and alcohol status.   GYN (Dr. Julien Girt) pap and pelvic exam 2019 .. scheduled.  Mammogram 2021 per report... planned at GYN  Up to date with Tdap 2012 (not interested). S/P COVID19 vaccine x 2, refused flu vaccine Denies  need for STD screen  Nonsmoker  ETOH occ / no drug use Father colon cancer age 48s.. Nongenetic.  PGM breast cancer.  Hep c negative  Problem List Items Addressed This Visit     Benign essential hypertension    Stable, chronic.  Continue current medication.   Well controlled on HCTZ 25 mg daily      Routine general medical examination at a health care facility - Primary   Eliezer Lofts, MD

## 2023-02-12 NOTE — Assessment & Plan Note (Signed)
Stable, chronic.  Continue current medication.   Well controlled on HCTZ 25 mg daily

## 2023-02-13 ENCOUNTER — Encounter: Payer: Self-pay | Admitting: Family Medicine

## 2023-02-17 ENCOUNTER — Encounter: Payer: Self-pay | Admitting: Family Medicine

## 2023-02-19 LAB — HM MAMMOGRAPHY

## 2023-07-29 NOTE — Progress Notes (Signed)
@Patient  ID: Christine Rivera, female    DOB: May 12, 1981, 42 y.o.   MRN: [id number]  Chief Complaint  Patient presents with   Consult    No prior sleep study Feels like jaw is dropping, not sleeping well through the night, snoring    Referring provider: Excell Seltzer, MD  HPI: 42 year old female.  Past medical history significant for hypertension.   07/30/2023 Patient presents today for sleep consult. She has new snoring symptoms. Sleeps with her mouth open.  Her main issue is not sleeping throught the night Takes her an hour plus to fall asleep. She will wake up 2 hours later.  She wakes up 8-9 times a night, she will sometimes have difficulty falling back to sleep.  She does not feel drowsy during the day  She sleeps separately from her husband  She has tried melatonin  Takes Hydrochlorothiazide for blood pressure   Sleep questionnaire Symptoms-  restless sleep, snoring  Prior sleep study- None  Bedtime- 10-11pm Time to fall asleep- an hour or longer Nocturnal awakenings- 8-10 times  Out of bed/start of day- 6:!5 Weight changes- up 10 bs  Do you operate heavy machinery- no Do you currently wear CPAP- no Do you current wear oxygen- no [place]- 0  Social hx: Patient is married with children.  She lives with her husband and 3 kids.  She works as an Pensions consultant.  She has 1 alcoholic drink a month.  No tobacco use.   Allergies  Allergen Reactions   Nickel Rash    Immunization History  Administered Date(s) Administered   PFIZER(Purple Top)SARS-COV-2 Vaccination 04/21/2020, 05/23/2020   Tdap 12/16/2010    Past Medical History:  Diagnosis Date   Allergy    Dysplastic nevus 10/11/2020   Left upper back 3.0 cm lat to spine, right inf side above hip   Hypertension 07/2015   controlled by medication   Medical history non-contributory     Tobacco History: Social History   Tobacco Use  Smoking Status Never  Smokeless Tobacco Never   Counseling given: Not  Answered   Outpatient Medications Prior to Visit  Medication Sig Dispense Refill   hydrochlorothiazide (HYDRODIURIL) 12.5 MG tablet Take 1 tablet (12.5 mg total) by mouth daily. TAKE 1 TABLET(12.5 MG) BY MOUTH DAILY Strength: 12.5 mg 30 tablet 11   JUNEL FE 1/20 1-20 MG-MCG tablet Take 1 tablet by mouth daily.     No facility-administered medications prior to visit.      Review of Systems  Review of Systems  Constitutional: Negative.   HENT: Negative.  Negative for congestion and postnasal drip.   Respiratory: Negative.    Cardiovascular: Negative.   Psychiatric/Behavioral:  Positive for sleep disturbance.      Physical Exam  BP 128/88 (BP Location: Left Arm, Patient Position: Sitting, Cuff Size: Normal)   Pulse 78   Ht 5\' 7"  (1.702 m)   Wt 188 lb 3.2 oz (85.4 kg)   SpO2 100%   BMI 29.48 kg/m  Physical Exam Constitutional:      Appearance: Normal appearance.  HENT:     Head: Normocephalic and atraumatic.     Mouth/Throat:     Mouth: Mucous membranes are moist.     Pharynx: Oropharynx is clear.  Cardiovascular:     Rate and Rhythm: Normal rate and regular rhythm.  Pulmonary:     Effort: Pulmonary effort is normal.     Breath sounds: Normal breath sounds.  Musculoskeletal:        General:  Normal range of motion.  Skin:    General: Skin is warm and dry.  Neurological:     General: No focal deficit present.     Mental Status: She is alert and oriented to person, place, and time. Mental status is at baseline.  Psychiatric:        Mood and Affect: Mood normal.        Behavior: Behavior normal.        Thought Content: Thought content normal.        Judgment: Judgment normal.      Lab Results:  CBC    Component Value Date/Time   WBC 8.4 03/13/2020 1737   RBC 4.90 03/13/2020 1737   HGB 13.0 03/13/2020 1737   HGB 13.3 03/03/2015 0000   HCT 40.6 03/13/2020 1737   HCT 40 03/03/2015 0000   PLT 283 03/13/2020 1737   PLT 197 03/03/2015 0000   MCV 82.9  03/13/2020 1737   MCH 26.5 03/13/2020 1737   MCHC 32.0 03/13/2020 1737   RDW 15.5 03/13/2020 1737   LYMPHSABS 2.1 03/13/2020 1737   MONOABS 0.5 03/13/2020 1737   EOSABS 0.3 03/13/2020 1737   BASOSABS 0.0 03/13/2020 1737    BMET    Component Value Date/Time   NA 138 02/06/2023 0829   K 3.7 02/06/2023 0829   CL 100 02/06/2023 0829   CO2 30 02/06/2023 0829   GLUCOSE 90 02/06/2023 0829   BUN 12 02/06/2023 0829   CREATININE 0.98 02/06/2023 0829   CALCIUM 9.4 02/06/2023 0829   GFRNONAA >60 03/13/2020 1737   GFRAA >60 03/13/2020 1737    BNP No results found for: "BNP"  ProBNP No results found for: "PROBNP"  Imaging: No results found.   Assessment & Plan:   Loud snoring - Patient has symptoms of loud snoring, disrupted sleep.  Associated insomnia. She wakes up approximately 8-9 times a night.  No significant daytime drowsiness.  Concern patient could have underlying obstructive sleep apnea, needs home sleep study evaluate.  We reviewed risks of untreated sleep apnea including cardiac rhythms, pulm hypertension, diabetes and stroke.  We discussed treatment options including weight loss, oral appliance, CPAP therapy referral to ENT for possible surgical options.  Encourage side sleeping position. She was given information for cognitive behavioral therapy to help with insomnia. Follow-up 1 to 2 weeks after sleep study review results and treatment options if needed.      Glenford Bayley, NP 08/04/2023

## 2023-07-30 ENCOUNTER — Encounter: Payer: Self-pay | Admitting: Primary Care

## 2023-07-30 ENCOUNTER — Ambulatory Visit (INDEPENDENT_AMBULATORY_CARE_PROVIDER_SITE_OTHER): Payer: 59 | Admitting: Primary Care

## 2023-07-30 VITALS — BP 128/88 | HR 78 | Ht 67.0 in | Wt 188.2 lb

## 2023-07-30 DIAGNOSIS — R0683 Snoring: Secondary | ICD-10-CM | POA: Diagnosis not present

## 2023-07-30 NOTE — Patient Instructions (Addendum)
  Sleep apnea is defined as period of 10 seconds or longer when you stop breathing at night. This can happen multiple times a night. Dx sleep apnea is when this occurs more than 5 times an hour.    Mild OSA 5-15 apneic events an hour Moderate OSA 15-30 apneic events an hour Severe OSA > 30 apneic events an hour   Untreated sleep apnea puts you at higher risk for cardiac arrhythmias, pulmonary HTN, stroke and diabetes  Treatment options include weight loss, side sleeping position, oral appliance, CPAP therapy or referral to ENT for possible surgical options    Recommendations: Focus on side sleeping position or elevate head with wedge pillow 30 degrees Work on weight loss efforts if able  Do not drive if experiencing excessive daytime sleepiness of fatigue   Reach out to cognitive behavioral health for sleep training / contact- [phone]   Orders: Home sleep study re: loud snoring (ordered)   Follow-up: Please call to schedule follow-up 1-2 weeks after completing home sleep study to review results and treatment if needed (can be virtual)

## 2023-08-04 DIAGNOSIS — R0683 Snoring: Secondary | ICD-10-CM | POA: Insufficient documentation

## 2023-08-04 NOTE — Assessment & Plan Note (Signed)
-   Patient has symptoms of loud snoring, disrupted sleep.  Associated insomnia. She wakes up approximately 8-9 times a night.  No significant daytime drowsiness.  Concern patient could have underlying obstructive sleep apnea, needs home sleep study evaluate.  We reviewed risks of untreated sleep apnea including cardiac rhythms, pulm hypertension, diabetes and stroke.  We discussed treatment options including weight loss, oral appliance, CPAP therapy referral to ENT for possible surgical options.  Encourage side sleeping position. She was given information for cognitive behavioral therapy to help with insomnia. Follow-up 1 to 2 weeks after sleep study review results and treatment options if needed.

## 2023-12-31 ENCOUNTER — Other Ambulatory Visit: Payer: Self-pay | Admitting: Family Medicine

## 2024-01-01 NOTE — Telephone Encounter (Signed)
Please schedule CPE with fasting labs prior for Dr. Ermalene Searing after 02/13/2024.

## 2024-01-01 NOTE — Telephone Encounter (Signed)
LVM for patient to c/b and schedule.  

## 2024-01-20 ENCOUNTER — Ambulatory Visit: Payer: 59 | Admitting: Family Medicine

## 2024-02-12 ENCOUNTER — Ambulatory Visit: Payer: 59 | Admitting: Family Medicine

## 2024-03-12 ENCOUNTER — Ambulatory Visit (INDEPENDENT_AMBULATORY_CARE_PROVIDER_SITE_OTHER): Payer: 59 | Admitting: Family Medicine

## 2024-03-12 ENCOUNTER — Encounter: Payer: Self-pay | Admitting: Family Medicine

## 2024-03-12 VITALS — BP 120/80 | HR 73 | Temp 98.1°F | Ht 67.0 in | Wt 191.4 lb

## 2024-03-12 DIAGNOSIS — Z1211 Encounter for screening for malignant neoplasm of colon: Secondary | ICD-10-CM

## 2024-03-12 DIAGNOSIS — I10 Essential (primary) hypertension: Secondary | ICD-10-CM

## 2024-03-12 DIAGNOSIS — Z0000 Encounter for general adult medical examination without abnormal findings: Secondary | ICD-10-CM

## 2024-03-12 NOTE — Progress Notes (Signed)
 Patient ID: Christine Rivera, female    DOB: December 29, 1980, 43 y.o.   MRN: [id number]  This visit was conducted in person.  BP 120/80 (BP Location: Left Arm, Patient Position: Sitting, Cuff Size: Large)   Pulse 73   Temp 98.1 F (36.7 C) (Temporal)   Ht 5\' 7"  (1.702 m)   Wt 191 lb 6 oz (86.8 kg)   LMP 03/06/2024   SpO2 99%   BMI 29.97 kg/m    CC:  Chief Complaint  Patient presents with   Annual Exam    Subjective:   HPI: Christine Rivera is a 43 y.o. female presenting on 03/12/2024 for Annual Exam  The patient presents for  complete physical and review of chronic health problems. He/She also has the following acute concerns today: none   Hypertension:  Well controlled on HCTZ 25 mg daily    BP Readings from Last 3 Encounters:  03/12/24 120/80  07/30/23 128/88  02/12/23 110/80  Using medication without problems or lightheadedness:  none Chest pain with exertion:none Edema: occ Short of breath:none Average home BPs:  not checking Other issues:  Due for repeat labs. Lab Results  Component Value Date   CHOL 154 02/06/2023   HDL 35.70 (L) 02/06/2023   LDLCALC 86 02/06/2023   LDLDIRECT 95.0 05/26/2020   TRIG 162.0 (H) 02/06/2023   CHOLHDL 4 02/06/2023    Diet: moderate  Exercise:   2 times a week walking  Relevant past medical, surgical, family and social history reviewed and updated as indicated. Interim medical history since our last visit reviewed. Allergies and medications reviewed and updated. Outpatient Medications Prior to Visit  Medication Sig Dispense Refill   hydrochlorothiazide (HYDRODIURIL) 12.5 MG tablet TAKE 1 TABLET BY MOUTH EVERY DAY 30 tablet 2   JUNEL FE 1/20 1-20 MG-MCG tablet Take 1 tablet by mouth daily.     No facility-administered medications prior to visit.     Per HPI unless specifically indicated in ROS section below Review of Systems  Constitutional:  Negative for fatigue and fever.  HENT:  Negative for congestion.   Eyes:  Negative for  pain.  Respiratory:  Negative for cough and shortness of breath.   Cardiovascular:  Negative for chest pain, palpitations and leg swelling.  Gastrointestinal:  Negative for abdominal pain.  Genitourinary:  Negative for dysuria and vaginal bleeding.  Musculoskeletal:  Negative for back pain.  Neurological:  Negative for syncope, light-headedness and headaches.  Psychiatric/Behavioral:  Negative for dysphoric mood.    Objective:  BP 120/80 (BP Location: Left Arm, Patient Position: Sitting, Cuff Size: Large)   Pulse 73   Temp 98.1 F (36.7 C) (Temporal)   Ht 5\' 7"  (1.702 m)   Wt 191 lb 6 oz (86.8 kg)   LMP 03/06/2024   SpO2 99%   BMI 29.97 kg/m   Wt Readings from Last 3 Encounters:  03/12/24 191 lb 6 oz (86.8 kg)  07/30/23 188 lb 3.2 oz (85.4 kg)  02/12/23 183 lb (83 kg)      Physical Exam Constitutional:      General: She is not in acute distress.    Appearance: Normal appearance. She is well-developed. She is not ill-appearing or toxic-appearing.  HENT:     Head: Normocephalic.     Right Ear: Hearing, tympanic membrane, ear canal and external ear normal. Tympanic membrane is not erythematous, retracted or bulging.     Left Ear: Hearing, tympanic membrane, ear canal and external ear normal. Tympanic membrane is not  erythematous, retracted or bulging.     Nose: No mucosal edema or rhinorrhea.     Right Sinus: No maxillary sinus tenderness or frontal sinus tenderness.     Left Sinus: No maxillary sinus tenderness or frontal sinus tenderness.     Mouth/Throat:     Pharynx: Uvula midline.  Eyes:     General: Lids are normal. Lids are everted, no foreign bodies appreciated.     Conjunctiva/sclera: Conjunctivae normal.     Pupils: Pupils are equal, round, and reactive to light.  Neck:     Thyroid: No thyroid mass or thyromegaly.     Vascular: No carotid bruit.     Trachea: Trachea normal.  Cardiovascular:     Rate and Rhythm: Normal rate and regular rhythm.     Pulses: Normal  pulses.     Heart sounds: Normal heart sounds, S1 normal and S2 normal. No murmur heard.    No friction rub. No gallop.  Pulmonary:     Effort: Pulmonary effort is normal. No tachypnea or respiratory distress.     Breath sounds: Normal breath sounds. No decreased breath sounds, wheezing, rhonchi or rales.  Abdominal:     General: Bowel sounds are normal.     Palpations: Abdomen is soft.     Tenderness: There is no abdominal tenderness.  Musculoskeletal:     Cervical back: Normal range of motion and neck supple.  Skin:    General: Skin is warm and dry.     Findings: No rash.  Neurological:     Mental Status: She is alert.  Psychiatric:        Mood and Affect: Mood is not anxious or depressed.        Speech: Speech normal.        Behavior: Behavior normal. Behavior is cooperative.        Thought Content: Thought content normal.        Judgment: Judgment normal.       Results for orders placed or performed in visit on 02/17/23  HM MAMMOGRAPHY   Collection Time: 01/04/22 12:00 AM  Result Value Ref Range   HM Mammogram 0-4 Bi-Rad 0-4 Bi-Rad, Self Reported Normal  HM PAP SMEAR   Collection Time: 01/04/22 12:00 AM  Result Value Ref Range   HM Pap smear      Negative for Intraepithelial Lesions or Malignancy. Benign Reactive/Reparative Changes  Results Console HPV   Collection Time: 01/04/22 12:00 AM  Result Value Ref Range   CHL HPV Negative     This visit occurred during the SARS-CoV-2 public health emergency.  Safety protocols were in place, including screening questions prior to the visit, additional usage of staff PPE, and extensive cleaning of exam room while observing appropriate contact time as indicated for disinfecting solutions.   COVID 19 screen:  No recent travel or known exposure to COVID19 The patient denies respiratory symptoms of COVID 19 at this time. The importance of social distancing was discussed today.   Assessment and Plan The patient's preventative  maintenance and recommended screening tests for an annual wellness exam were reviewed in full today. Brought up to date unless services declined.  Counselled on the importance of diet, exercise, and its role in overall health and mortality. The patient's FH and SH was reviewed, including their home life, tobacco status, and drug and alcohol status.   GYN (Dr. Renaldo Fiddler) pap and pelvic exam 2023   Mammogram 2021 per report... planned at GYN  Up to date  with Tdap 2012 (not interested). S/P COVID19 vaccine x 2, refused flu vaccine Denies  need for STD screen  Nonsmoker  ETOH occ / no drug use Father colon cancer age 75s.. Nongenetic... due for colonoscopy  PGM breast cancer.  Hep c negative  Problem List Items Addressed This Visit     Benign essential hypertension   Stable, chronic.  Continue current medication.   Well controlled on HCTZ 25 mg daily      Relevant Orders   Comprehensive metabolic panel with GFR   Lipid panel   Routine general medical examination at a health care facility - Primary   Other Visit Diagnoses       Colon cancer screening       Relevant Orders   Ambulatory referral to Gastroenterology      Return for  fasting labs .  Kerby Nora, MD

## 2024-03-12 NOTE — Assessment & Plan Note (Signed)
Stable, chronic.  Continue current medication.   Well controlled on HCTZ 25 mg daily 

## 2024-05-24 ENCOUNTER — Other Ambulatory Visit: Payer: Self-pay | Admitting: Family Medicine

## 2024-05-24 NOTE — Telephone Encounter (Signed)
 Last office visit 03/12/2024.  Refill request and Med list has hydrochlorothiazide  12.5 mg daily but in office note it states stable on HCTZ 25 mg daily.   Please advise.

## 2024-09-15 ENCOUNTER — Encounter (HOSPITAL_COMMUNITY): Payer: Self-pay | Admitting: Emergency Medicine

## 2024-09-15 ENCOUNTER — Emergency Department (HOSPITAL_COMMUNITY)
Admission: EM | Admit: 2024-09-15 | Discharge: 2024-09-15 | Disposition: A | Discharge status: Home / Self Care | Attending: Emergency Medicine | Admitting: Emergency Medicine

## 2024-09-15 ENCOUNTER — Other Ambulatory Visit: Payer: Self-pay

## 2024-09-15 ENCOUNTER — Ambulatory Visit: Admitting: Family Medicine

## 2024-09-15 DIAGNOSIS — E876 Hypokalemia: Secondary | ICD-10-CM | POA: Diagnosis not present

## 2024-09-15 DIAGNOSIS — I10 Essential (primary) hypertension: Secondary | ICD-10-CM | POA: Diagnosis present

## 2024-09-15 DIAGNOSIS — Z79899 Other long term (current) drug therapy: Secondary | ICD-10-CM | POA: Insufficient documentation

## 2024-09-15 LAB — CBC
HCT: 42.1 % (ref 36.0–46.0)
Hemoglobin: 13.9 g/dL (ref 12.0–15.0)
MCH: 28.3 pg (ref 26.0–34.0)
MCHC: 33 g/dL (ref 30.0–36.0)
MCV: 85.7 fL (ref 80.0–100.0)
Platelets: 234 K/uL (ref 150–400)
RBC: 4.91 MIL/uL (ref 3.87–5.11)
RDW: 13.7 % (ref 11.5–15.5)
WBC: 7.7 K/uL (ref 4.0–10.5)
nRBC: 0 % (ref 0.0–0.2)

## 2024-09-15 LAB — BASIC METABOLIC PANEL WITH GFR
Anion gap: 11 (ref 5–15)
BUN: 17 mg/dL (ref 6–20)
CO2: 26 mmol/L (ref 22–32)
Calcium: 8.8 mg/dL — ABNORMAL LOW (ref 8.9–10.3)
Chloride: 100 mmol/L (ref 98–111)
Creatinine, Ser: 1.06 mg/dL — ABNORMAL HIGH (ref 0.44–1.00)
GFR, Estimated: >60 mL/min (ref >60)
Glucose, Bld: 114 mg/dL — ABNORMAL HIGH (ref 70–99)
Potassium: 2.9 mmol/L — ABNORMAL LOW (ref 3.5–5.1)
Sodium: 137 mmol/L (ref 135–145)

## 2024-09-15 LAB — HCG, SERUM, QUALITATIVE: Preg, Serum: NEGATIVE

## 2024-09-15 MED ORDER — POTASSIUM CHLORIDE 20 MEQ PO PACK
80.0000 meq | PACK | Freq: Every day | ORAL | Status: DC
  Administered 2024-09-15: 80 meq via ORAL
  Filled 2024-09-15: qty 4

## 2024-09-15 MED ORDER — POTASSIUM CHLORIDE CRYS ER 20 MEQ PO TBCR: 20.0000 meq | EXTENDED_RELEASE_TABLET | Freq: Two times a day (BID) | ORAL | 0 refills | Status: AC

## 2024-09-15 NOTE — ED Provider Notes (Signed)
  EMERGENCY DEPARTMENT AT [facility] Provider Note   CSN: [id number] Arrival date & time: 09/15/24  9749     Patient presents with: Hypertension   Christine Rivera is a 43 y.o. female with history of hypertension.  Patient presents to the ED for evaluation of high blood pressure.  States that over the last week or so she has had higher blood pressure readings at home than normal.  States that they have been 150 systolic.  She reports that she has been feeling generally unwell, off.  She cannot specify what she feels off about.  She denies any chest pain, shortness of breath, headache, blurred vision.  Denies abdominal pain, nausea or vomiting, diarrhea.  Denies any fevers at home.  Denies known sick contacts.  States she takes tropin 5 mg HCTZ which she is compliant on.  Reports that she will sometimes miss doses but reports that typically she will take 1 tablet a day.  She states that she has been diagnosed with high blood pressure since 2016.  She denies any specific complaint.  She just reports that she feels off.  She states that this could be secondary to anxiety she feels related to her blood pressure.   Hypertension       Prior to Admission medications   Medication Sig Start Date End Date Taking? Authorizing Provider  potassium chloride SA (KLOR-CON M) 20 MEQ tablet Take 1 tablet (20 mEq total) by mouth 2 (two) times daily. 09/15/24  Yes Ruthell Lonni FALCON, PA-C  hydrochlorothiazide  (HYDRODIURIL ) 12.5 MG tablet TAKE 1 TABLET BY MOUTH EVERY DAY 05/25/24   Avelina Greig BRAVO, MD    Allergies: Nickel    Review of Systems  All other systems reviewed and are negative.   Updated Vital Signs BP (!) 156/97   Pulse 89   Temp 98 F (36.7 C) (Oral)   Resp 20   Ht 5' 7 (1.702 m)   Wt 86 kg   SpO2 100%   BMI 29.69 kg/m   Physical Exam Vitals and nursing note reviewed.  Constitutional:      General: She is not in acute distress.    Appearance: She is  well-developed.  HENT:     Head: Normocephalic and atraumatic.  Eyes:     Conjunctiva/sclera: Conjunctivae normal.  Cardiovascular:     Rate and Rhythm: Normal rate and regular rhythm.     Heart sounds: No murmur heard. Pulmonary:     Effort: Pulmonary effort is normal. No respiratory distress.     Breath sounds: Normal breath sounds.  Abdominal:     Palpations: Abdomen is soft.     Tenderness: There is no abdominal tenderness.  Musculoskeletal:        General: No swelling.     Cervical back: Neck supple.  Skin:    General: Skin is warm and dry.     Capillary Refill: Capillary refill takes less than 2 seconds.  Neurological:     General: No focal deficit present.     Mental Status: She is alert.     GCS: GCS eye subscore is 4. GCS verbal subscore is 5. GCS motor subscore is 6.     Cranial Nerves: Cranial nerves 2-12 are intact. No cranial nerve deficit.     Sensory: Sensation is intact. No sensory deficit.     Motor: Motor function is intact. No weakness.     Coordination: Heel to Shin Test normal.     Comments: CN III through  XII intact.  Intact finger-to-nose, heel-to-shin.  No pronator drift.  No slurred speech.  No facial droop.  Equal strength throughout.  Equal sensation throughout.  PERRL.  Tracks cross midline.  Alert and oriented x 4.  Psychiatric:        Mood and Affect: Mood normal.     (all labs ordered are listed, but only abnormal results are displayed) Labs Reviewed  BASIC METABOLIC PANEL WITH GFR - Abnormal; Notable for the following components:      Result Value   Potassium 2.9 (*)    Glucose, Bld 114 (*)    Creatinine, Ser 1.06 (*)    Calcium  8.8 (*)    All other components within normal limits  CBC  HCG, SERUM, QUALITATIVE    EKG: EKG Interpretation Date/Time:  Wednesday September 15 2024 05:17:17 EDT Ventricular Rate:  82 PR Interval:  171 QRS Duration:  110 QT Interval:  393 QTC Calculation: 459 R Axis:   -15  Text Interpretation: Sinus  rhythm Borderline left axis deviation RSR' in V1 or V2, probably normal variant Confirmed by Jerral Meth [phone]) on 09/15/2024 5:42:49 AM  Radiology: No results found.  Procedures   Medications Ordered in the ED  potassium chloride (KLOR-CON) packet 80 mEq (80 mEq Oral Given 09/15/24 0542)    Medical Decision Making Amount and/or Complexity of Data Reviewed Labs: ordered. ECG/medicine tests: ordered.  Risk Prescription drug management.   This is a 43 year old female presenting to the ED out of concern of hypertension.  On exam, her blood pressure is 156/97.  She is afebrile and nontachycardic.  Her lung sounds are clear bilaterally, there is no hypoxia.  Abdomen is soft and compressible.  Neurological examination at baseline.  Patient overall nontoxic in appearance.  Patient reports that she has had elevated blood pressure readings at home over the last 1 week.  She is compliant on 12.5 mg HCTZ.  She reports that she has had no headache, chest pain, shortness of breath or blurred vision.  She reports she just feels off but when asked to further clarify what she means she cannot provide any specific details.  She denies any known sick contacts.  Denies any fevers.  Here her blood pressure is 156/97 which she reports is similar to her home readings.  Labs collected in triage include CBC and a metabolic panel.  CBC without leukocytosis or anemia.  Metabolic panel with potassium 2.9, creatinine 1.06 which appears to be around baseline.  Anion gap 11.  Suspect hypokalemia due to patient taking HCTZ.  Does not take any daily potassium supplements.  Patient was given 80 mEq oral potassium.  Her EKG was collected which shows no flattened T waves.  She lives at home at this time and advised to continue to monitor blood pressure, track BP and follow-up with PCP.  She was advised to return to ED with new symptoms such as chest pain, shortness of breath, headache or blurred vision.  She was sent  home with daily potassium supplement and was advised to potassium rechecked by PCP in the next 1 week.  She voiced understanding.  She is stable to discharge at this time.   Final diagnoses:  Asymptomatic hypertension  Hypokalemia    ED Discharge Orders          Ordered    potassium chloride SA (KLOR-CON M) 20 MEQ tablet  2 times daily        09/15/24 0545  Ruthell Lonni FALCON, PA-C 09/15/24 [id number]    Jerral Meth, MD 09/15/24 2258

## 2024-09-15 NOTE — Discharge Instructions (Addendum)
 It was a pleasure taking part in your care.  As we discussed, please follow-up with your PCP regarding your elevated blood pressure readings at home.  If you develop chest pain, shortness of breath, blurred vision or headache in the setting of high blood pressure please return to the ED.  Please take potassium supplements 20 mEq by mouth twice daily for the next 5 days.  Please follow-up with your PCP to have your potassium rechecked.  Please record your blood pressure at home and bring this to your PCP for evaluation.

## 2024-09-15 NOTE — ED Triage Notes (Signed)
 Patient here stating that since last Friday she noticed that her blood pressure has been more elevated. She is able to take it at home and noticed its been in the 140s systolic. Typically in the 120s systolic. Takes 12.5 mg of hydrochlorothiazide  once a day. Since then she has felt anxious, and off, states I just don't fee like myself, reports almost a brain fog, intermittently dizzy. Denies any neurological symptoms.

## 2024-09-24 ENCOUNTER — Ambulatory Visit: Admitting: Family Medicine

## 2024-09-24 ENCOUNTER — Ambulatory Visit: Payer: Self-pay | Admitting: Family Medicine

## 2024-09-24 VITALS — BP 118/80 | HR 81 | Temp 97.3°F | Ht 67.0 in | Wt 173.6 lb

## 2024-09-24 DIAGNOSIS — I10 Essential (primary) hypertension: Secondary | ICD-10-CM | POA: Diagnosis not present

## 2024-09-24 DIAGNOSIS — E876 Hypokalemia: Secondary | ICD-10-CM | POA: Diagnosis not present

## 2024-09-24 LAB — BASIC METABOLIC PANEL WITH GFR
BUN: 10 mg/dL (ref 6–23)
CO2: 32 meq/L (ref 19–32)
Calcium: 9 mg/dL (ref 8.4–10.5)
Chloride: 99 meq/L (ref 96–112)
Creatinine, Ser: 0.9 mg/dL (ref 0.40–1.20)
GFR: 77.13 mL/min (ref >60.00)
Glucose, Bld: 93 mg/dL (ref 70–99)
Potassium: 3.7 meq/L (ref 3.5–5.1)
Sodium: 139 meq/L (ref 135–145)

## 2024-09-24 MED ORDER — TRIAMTERENE-HCTZ 37.5-25 MG PO CAPS: 1.0000 | ORAL_CAPSULE | Freq: Every day | ORAL | 11 refills | Status: AC

## 2024-09-24 NOTE — Assessment & Plan Note (Signed)
 Chronic with recent acute worsening.  Possibly due to not feeling well from low potassium and resulting stress. Blood pressure has improved some now although diastolic is high normal.  We will stop hydrochlorothiazide  12.5 mg daily and changed to combination triamterene/hydrochlorothiazide  37.5/25 mg daily. To continue to keep potassium up we will have her restart her multivitamin and continue heart healthy diet.

## 2024-09-24 NOTE — Assessment & Plan Note (Addendum)
 Acute, possibly due to hydrochlorothiazide , decreased intake or occasional diarrhea.  status post 5-day repletion. Will reevaluate potassium in office today.

## 2024-09-24 NOTE — Progress Notes (Signed)
 Patient ID: Christine Rivera, female    DOB: 08-17-1981, 43 y.o.   MRN: [id number]  This visit was conducted in person.  BP 118/80   Pulse 81   Temp (!) 97.3 F (36.3 C) (Temporal)   Ht 5' 7 (1.702 m)   Wt 173 lb 9.6 oz (78.7 kg)   SpO2 99%   BMI 27.19 kg/m    CC:  Chief Complaint  Patient presents with   Hospitalization Follow-up    Subjective:   HPI: Christine Rivera is a 43 y.o. female presenting on 09/24/2024 for Hospitalization Follow-up  Patient presented to ED on September 15, 2024 for evaluation of high blood pressure.  She had stated that over the preceding week she had noted higher blood pressure readings than normal.  Systolic 150. She denied chest pain, shortness of breath, headache or blurred vision. Previously she has been on hydrochlorothiazide  12.5 mg daily  Blood pressure in ER was 156/97. Labs showed unremarkable CBC.  CMET showed potassium 2.9 creatinine 1.06. Hypokalemia likely due to hydrochlorothiazide . EKG normal sinus rhythm. Started on daily potassium supplement 20 meQ x 5 days.  Today she reports  she is feeling better overall.  BP now well controlled  120/80s.  NO chest pain, no SOB. No leg cramps.   Relevant past medical, surgical, family and social history reviewed and updated as indicated. Interim medical history since our last visit reviewed. Allergies and medications reviewed and updated. Outpatient Medications Prior to Visit  Medication Sig Dispense Refill   potassium chloride SA (KLOR-CON M) 20 MEQ tablet Take 1 tablet (20 mEq total) by mouth 2 (two) times daily. 10 tablet 0   hydrochlorothiazide  (HYDRODIURIL ) 12.5 MG tablet TAKE 1 TABLET BY MOUTH EVERY DAY 30 tablet 2   No facility-administered medications prior to visit.     Per HPI unless specifically indicated in ROS section below Review of Systems  Constitutional:  Negative for fatigue and fever.  HENT:  Negative for congestion.   Eyes:  Negative for pain.  Respiratory:  Negative  for cough and shortness of breath.   Cardiovascular:  Negative for chest pain, palpitations and leg swelling.  Gastrointestinal:  Negative for abdominal pain.  Genitourinary:  Negative for dysuria and vaginal bleeding.  Musculoskeletal:  Negative for back pain.  Neurological:  Negative for syncope, light-headedness and headaches.  Psychiatric/Behavioral:  Negative for dysphoric mood.    Objective:  BP 118/80   Pulse 81   Temp (!) 97.3 F (36.3 C) (Temporal)   Ht 5' 7 (1.702 m)   Wt 173 lb 9.6 oz (78.7 kg)   SpO2 99%   BMI 27.19 kg/m   Wt Readings from Last 3 Encounters:  09/24/24 173 lb 9.6 oz (78.7 kg)  09/15/24 189 lb 9.5 oz (86 kg)  03/12/24 191 lb 6 oz (86.8 kg)      Physical Exam Constitutional:      General: She is not in acute distress.    Appearance: Normal appearance. She is well-developed. She is not ill-appearing or toxic-appearing.  HENT:     Head: Normocephalic.     Right Ear: Hearing, tympanic membrane, ear canal and external ear normal. Tympanic membrane is not erythematous, retracted or bulging.     Left Ear: Hearing, tympanic membrane, ear canal and external ear normal. Tympanic membrane is not erythematous, retracted or bulging.     Nose: No mucosal edema or rhinorrhea.     Right Sinus: No maxillary sinus tenderness or frontal sinus tenderness.  Left Sinus: No maxillary sinus tenderness or frontal sinus tenderness.     Mouth/Throat:     Pharynx: Uvula midline.  Eyes:     General: Lids are normal. Lids are everted, no foreign bodies appreciated.     Conjunctiva/sclera: Conjunctivae normal.     Pupils: Pupils are equal, round, and reactive to light.  Neck:     Thyroid : No thyroid  mass or thyromegaly.     Vascular: No carotid bruit.     Trachea: Trachea normal.  Cardiovascular:     Rate and Rhythm: Normal rate and regular rhythm.     Pulses: Normal pulses.     Heart sounds: Normal heart sounds, S1 normal and S2 normal. No murmur heard.    No  friction rub. No gallop.  Pulmonary:     Effort: Pulmonary effort is normal. No tachypnea or respiratory distress.     Breath sounds: Normal breath sounds. No decreased breath sounds, wheezing, rhonchi or rales.  Abdominal:     General: Bowel sounds are normal.     Palpations: Abdomen is soft.     Tenderness: There is no abdominal tenderness.  Musculoskeletal:     Cervical back: Normal range of motion and neck supple.  Skin:    General: Skin is warm and dry.     Findings: No rash.  Neurological:     Mental Status: She is alert.  Psychiatric:        Mood and Affect: Mood is not anxious or depressed.        Speech: Speech normal.        Behavior: Behavior normal. Behavior is cooperative.        Thought Content: Thought content normal.        Judgment: Judgment normal.       Results for orders placed or performed during the hospital encounter of 09/15/24  Basic metabolic panel   Collection Time: 09/15/24  3:07 AM  Result Value Ref Range   Sodium 137 135 - 145 mmol/L   Potassium 2.9 (L) 3.5 - 5.1 mmol/L   Chloride 100 98 - 111 mmol/L   CO2 26 22 - 32 mmol/L   Glucose, Bld 114 (H) 70 - 99 mg/dL   BUN 17 6 - 20 mg/dL   Creatinine, Ser 8.93 (H) 0.44 - 1.00 mg/dL   Calcium  8.8 (L) 8.9 - 10.3 mg/dL   GFR, Estimated >39 >39 mL/min   Anion gap 11 5 - 15  CBC   Collection Time: 09/15/24  3:07 AM  Result Value Ref Range   WBC 7.7 4.0 - 10.5 K/uL   RBC 4.91 3.87 - 5.11 MIL/uL   Hemoglobin 13.9 12.0 - 15.0 g/dL   HCT 57.8 63.9 - 53.9 %   MCV 85.7 80.0 - 100.0 fL   MCH 28.3 26.0 - 34.0 pg   MCHC 33.0 30.0 - 36.0 g/dL   RDW 86.2 88.4 - 84.4 %   Platelets 234 150 - 400 K/uL   nRBC 0.0 0.0 - 0.2 %  hCG, serum, qualitative   Collection Time: 09/15/24  3:07 AM  Result Value Ref Range   Preg, Serum NEGATIVE NEGATIVE    Assessment and Plan  Benign essential hypertension Assessment & Plan: Chronic with recent acute worsening.  Possibly due to not feeling well from low potassium  and resulting stress. Blood pressure has improved some now although diastolic is high normal.  We will stop hydrochlorothiazide  12.5 mg daily and changed to combination triamterene/hydrochlorothiazide  37.5/25 mg daily. To  continue to keep potassium up we will have her restart her multivitamin and continue heart healthy diet.  Orders: -     Basic metabolic panel with GFR  Hypokalemia Assessment & Plan: Acute, possibly due to hydrochlorothiazide , decreased intake or occasional diarrhea.  status post 5-day repletion. Will reevaluate potassium in office today.  Orders: -     Basic metabolic panel with GFR  Other orders -     Triamterene-HCTZ; Take 1 each (1 capsule total) by mouth daily.  Dispense: 30 capsule; Refill: 11    No follow-ups on file.   Greig Ring, MD

## 2024-10-05 ENCOUNTER — Other Ambulatory Visit: Payer: Self-pay | Admitting: Family Medicine
